# Patient Record
Sex: Female | Born: 1975 | Race: Black or African American | Hispanic: No | Marital: Married | Smoking: Never smoker
Health system: Southern US, Community
[De-identification: ages and names within clinical notes are randomized; demographics above are authoritative.]

## PROBLEM LIST (undated history)

## (undated) ENCOUNTER — Emergency Department (HOSPITAL_COMMUNITY): Payer: 59

## (undated) DIAGNOSIS — Z789 Other specified health status: Secondary | ICD-10-CM

## (undated) HISTORY — DX: Other specified health status: Z78.9

---

## 2004-09-21 ENCOUNTER — Ambulatory Visit: Payer: Self-pay | Admitting: Family Medicine

## 2005-01-07 ENCOUNTER — Inpatient Hospital Stay (HOSPITAL_COMMUNITY): Admission: AD | Admit: 2005-01-07 | Discharge: 2005-01-10 | Payer: Self-pay | Admitting: Physician Assistant

## 2005-01-11 ENCOUNTER — Encounter: Admission: RE | Admit: 2005-01-11 | Discharge: 2005-02-10 | Payer: Self-pay | Admitting: Obstetrics

## 2006-09-30 ENCOUNTER — Ambulatory Visit (HOSPITAL_COMMUNITY): Admission: RE | Admit: 2006-09-30 | Discharge: 2006-09-30 | Payer: Self-pay | Admitting: Family Medicine

## 2007-02-28 ENCOUNTER — Ambulatory Visit: Payer: Self-pay | Admitting: Gynecology

## 2007-02-28 ENCOUNTER — Inpatient Hospital Stay (HOSPITAL_COMMUNITY): Admission: RE | Admit: 2007-02-28 | Discharge: 2007-03-03 | Payer: Self-pay | Admitting: Gynecology

## 2007-06-12 ENCOUNTER — Ambulatory Visit: Payer: Self-pay | Admitting: Family Medicine

## 2011-02-06 NOTE — Discharge Summary (Signed)
NAMEDEVERY, ODWYER               ACCOUNT NO.:  [id number]   MEDICAL RECORD NO.:  [id number]          PATIENT TYPE:  INP   LOCATION:  9128                          [facility]ILITY:  [facility]   PHYSICIAN:  Tracy L. Mayford Knife, M.D.DATE OF BIRTH:  01/13/1976   DATE OF ADMISSION:  02/28/2007  DATE OF DISCHARGE:                               DISCHARGE SUMMARY   ADDENDUM:  Admit hemoglobin was 13.7, discharge hemoglobin 9.3.  Admit  platelets 97,000, discharge platelets 92,000.           ______________________________  Marc Morgans. Mayford Knife, M.D.     TLW/MEDQ  D:  03/02/2007  T:  03/03/2007  Job:  [id number]

## 2011-02-06 NOTE — Op Note (Signed)
NAMEJABREE, PERNICE NO.:  [id number]   MEDICAL RECORD NO.:  [id number]          PATIENT TYPE:  INP   LOCATION:  9199                          [facility]ACILITY:  [facility]   PHYSICIAN:  Ginger Carne, MD  DATE OF BIRTH:  Dec 07, 1975   DATE OF PROCEDURE:  02/28/2007  DATE OF DISCHARGE:                               OPERATIVE REPORT   PREOPERATIVE DIAGNOSIS:  Intrauterine pregnancy at term, breech infant  on ultrasound.   POSTOPERATIVE DIAGNOSIS:  Intrauterine pregnancy at term, cephalic  presentation.   PROCEDURE:  Primary low transverse cesarean section.   SURGEON:  Ginger Carne, M.D.   ASSISTANT:  Paticia Stack, M.D.   ANESTHESIA:  Spinal.   SPECIMEN:  Placenta sent to labor and delivery.   ESTIMATED BLOOD LOSS:  1000 mL.   COMPLICATIONS:  None.   FINDINGS:  Viable female infant with Apgars of 8 and 9 nine at 1 and 5  minutes respectively.  Birth weight 7 pounds 15 ounces.   INDICATIONS FOR PROCEDURE:  This is a 35 year old gravida 2, para 1-0-0-  1, at 40 weeks 5 days gestation, who presented for post date fetal  testing, found to have baby in breech presentation.  She was taken for  primary cesarean section.   DESCRIPTION OF PROCEDURE:  The patient was taken to the operating room  where her spinal anesthesia was found to be adequate.  She was then  prepped and draped in a normal sterile fashion in the dorsal supine  position with a leftward tilt.  A Pfannenstiel skin incision was then  made with a scalpel and carried through to the underlying layer of  fascia.  The fascia was incised in the midline and the incision extended  laterally with the Mayo scissors.  The superior aspect of the fascial  incision was then grasped with Kocher clamps, elevated and the  underlying rectus muscles dissected off bluntly.  Attention was then  turned to the inferior aspect of the incision which, in a similar  fashion, was grasped with Kocher clamps, elevated, and  underlying muscle  rectus muscles dissected off bluntly.  The rectus muscles were then  separated in the midline and the peritoneum identified, tented up and  entered sharply with the Metzenbaum scissors. The peritoneal incision  was then extended superiorly and inferiorly with good visualization of  the bladder. The bladder blade was inserted and the vesicouterine  peritoneum identified, grasped with the pickups, and entered sharply  with the Metzenbaum scissors.  The incision was then extended laterally  and the bladder flap created digitally.   The bladder blade was then reinserted and the lower uterine segment  incised in a transverse fashion with a scalpel.  This uterine incision  was then extended bluntly.  The bladder blade was removed.  The infant  was found to be in cephalic presentation.  The infant's head was  delivered atraumatically.  No nuchal cord.  The nose and mouth were  suctioned and the cord clamped and cut.  The infant was handed off to  the waiting pediatricians.  Cord blood was  obtained.  The placenta was  then removed manually.  The uterus exteriorized and cleared of all clots  and debris.  The uterine incision was repaired with 0 Vicryl in a  running locked fashion.  Several figure-of-eight stitches were used to  reinforce the uterine incision with excellent hemostasis noted.  The  gutters were cleared of all clots and the fascia was reapproximated with  0 Vicryl in a running fashion.  The skin was closed with staples.  The  patient tolerated the procedure well.  Sponge, lap and needle counts  were correct x2.  2 grams of Ancef was given at cord clamp.  The patient  was taken to the recovery room in stable condition.     ______________________________  Paticia Stack, MD      Ginger Carne, MD  Electronically Signed    LNJ/MEDQ  D:  02/28/2007  T:  02/28/2007  Job:  [id number]

## 2011-02-06 NOTE — Discharge Summary (Signed)
NAMECHARLAINE, UTSEY               ACCOUNT NO.:  [id number]   MEDICAL RECORD NO.:  [id number]          PATIENT TYPE:  INP   LOCATION:  9128                          [facility]ILITY:  [facility]   PHYSICIAN:  Tracy L. Mayford Knife, M.D.DATE OF BIRTH:  Jan 24, 1976   DATE OF ADMISSION:  02/28/2007  DATE OF DISCHARGE:  03/03/2007                               DISCHARGE SUMMARY   DISCHARGE DIAGNOSES:  1. Status post primary cesarean section for breech infant that at the      time of delivery was found to be cephalic.  2. Thrombocytopenia.  3. Anemia secondary to acute blood loss.   HOSPITAL COURSE:  The patient is a 35 year old G2, P1-0-0-1 at 40 weeks,  5 days who presented for antenatal testing.  The patient was found to be  breech.  Dr. Mia Creek was consulted, and the decision was made to  proceed with cesarean section.   Please see operative note for full details.  At the time of delivery,  the baby did turn to cephalic.  Postoperative course was unremarkable.  The patient was voiding, ambulating and tolerating p.o.   DISPOSITION:  Home in stable condition.   FO[facility]UP[facility]   DISCHARGE MEDICATIONS:  1. Percocet 5, 1-2 p.o. q.4-6h.  2. Midrin 600 mg one tablet by mouth every 6 hours p.r.n. pain.  3. Micronor one tablet p.o. daily at the same time.  She is to change      birth control when she stops breast feeding.   ACTIVITY:  Nothing per vagina.  No heavy lifting x6 weeks.   DIET:  Regular.           ______________________________  Marc Morgans. Mayford Knife, M.D.     TLW/MEDQ  D:  03/02/2007  T:  03/03/2007  Job:  [id number]

## 2011-02-06 NOTE — Group Therapy Note (Signed)
Heather Hawkins, SPRUNG NO.:  [id number]   MEDICAL RECORD NO.:  [id number]          PATIENT TYPE:  WOC   LOCATION:  [facility]                   FACILITY:  [facility]   PHYSICIAN:  Tinnie Gens, MD        DATE OF BIRTH:  [birth year]   DATE OF SERVICE:  06/12/2007                                  CLINIC NOTE   HISTORY:  This is a G 2, P 2-0-0-2, who is 3-1/2 months status post LTCS  done here for breech presentation.  Her chief concern today is  incisional pain which she describes as sharp, knife-like, and  superficial, worse with leaning forward and sometimes when she moves.  It has been present and pretty much unchanged since her surgery.  She  did go to her 6 week visit at [facility] and had cultures done as  well as Pap smear.  She states she was given 1 tablet at that visit for  possible vaginal infection.  She is breast-feeding and using Micronor  with no problems.  She also reports that since about 2-3 weeks after her  operation, she has had dysuria on and off.  In addition, she has had  dyspareunia with vaginal discomfort as well as pain at the scar.  She  and her husband also are expressing regrets about having had the C-  section.   LIMITED EXAM:  Temp 97.5, pulse 67, BP 117/75, weight 190.  ABDOMEN:  Soft, essentially nontender to palpation.  There is some linea  nigra still present and the same color of more punctate discoloration  extensively surrounding the linea nigra.  The scar itself is healing but  still slightly discolored and red.  There is no induration or erythema  present, and it is essentially nontender to palpation.  PELVIC:  Deferred since this was done a month ago.   ASSESSMENT:  Slow wound healing.   PLAN:  The patient is seen along with Dr. Shawnie Pons, who explained to the  patient that the recovery can be prolonged through the sixth month and  that her pain will improve with time and that there is nothing  consistent with infection that she  should be concerned about.  She also  went over the birth experience with her, and the couple seemed satisfied  with that.  They will try Mederma topically for improvement of  appearance of the scar, and she is to return in 1 year for family  planning, gyn visit.     ______________________________  Caren Griffins, CNM    ______________________________  Tinnie Gens, MD    DP/MEDQ  D:  06/12/2007  T:  06/12/2007  Job:  [id number]

## 2011-07-12 LAB — DIFFERENTIAL
Basophils Absolute: 0
Eosinophils Absolute: 0.1
Eosinophils Relative: 1
Lymphocytes Relative: 24
Neutro Abs: 6.4

## 2011-07-12 LAB — RPR: RPR Ser Ql: NONREACTIVE

## 2011-07-12 LAB — CBC
HCT: 27.8 — ABNORMAL LOW
HCT: 29.1 — ABNORMAL LOW
MCHC: 34.3
MCV: 88.1
RBC: 4.71
RDW: 14.9 — ABNORMAL HIGH
RDW: 15.1 — ABNORMAL HIGH
WBC: 9.9

## 2011-11-06 ENCOUNTER — Ambulatory Visit (HOSPITAL_COMMUNITY)
Admission: RE | Admit: 2011-11-06 | Discharge: 2011-11-06 | Disposition: A | Discharge status: Home / Self Care | Payer: Medicaid Other | Source: Ambulatory Visit | Attending: Obstetrics & Gynecology | Admitting: Obstetrics & Gynecology

## 2011-11-06 ENCOUNTER — Ambulatory Visit (INDEPENDENT_AMBULATORY_CARE_PROVIDER_SITE_OTHER): Payer: Self-pay | Admitting: *Deleted

## 2011-11-06 VITALS — BP 116/73 | Temp 97.0°F | Ht 62.0 in | Wt 210.4 lb

## 2011-11-06 DIAGNOSIS — O09529 Supervision of elderly multigravida, unspecified trimester: Secondary | ICD-10-CM | POA: Insufficient documentation

## 2011-11-06 DIAGNOSIS — Z23 Encounter for immunization: Secondary | ICD-10-CM

## 2011-11-06 DIAGNOSIS — O0930 Supervision of pregnancy with insufficient antenatal care, unspecified trimester: Secondary | ICD-10-CM | POA: Insufficient documentation

## 2011-11-06 MED ORDER — TETANUS-DIPHTH-ACELL PERTUSSIS 5-2.5-18.5 LF-MCG/0.5 IM SUSP
0.5000 mL | Freq: Once | INTRAMUSCULAR | Status: AC
  Administered 2011-11-06: 0.5 mL via INTRAMUSCULAR

## 2011-11-06 MED ORDER — INFLUENZA VIRUS VACC SPLIT PF IM SUSP
0.5000 mL | INTRAMUSCULAR | Status: AC
  Administered 2011-11-06: 0.5 mL via INTRAMUSCULAR

## 2011-11-07 ENCOUNTER — Ambulatory Visit (INDEPENDENT_AMBULATORY_CARE_PROVIDER_SITE_OTHER): Payer: Medicaid Other | Admitting: Physician Assistant

## 2011-11-07 ENCOUNTER — Encounter: Payer: Self-pay | Admitting: Physician Assistant

## 2011-11-07 VITALS — BP 133/80 | Temp 98.2°F | Wt 208.3 lb

## 2011-11-07 DIAGNOSIS — O0930 Supervision of pregnancy with insufficient antenatal care, unspecified trimester: Secondary | ICD-10-CM

## 2011-11-07 DIAGNOSIS — D696 Thrombocytopenia, unspecified: Secondary | ICD-10-CM

## 2011-11-07 LAB — POCT URINALYSIS DIP (DEVICE)
Bilirubin Urine: NEGATIVE
Glucose, UA: NEGATIVE mg/dL
Hgb urine dipstick: NEGATIVE
Ketones, ur: NEGATIVE mg/dL
Urobilinogen, UA: 1 mg/dL (ref 0.0–1.0)

## 2011-11-07 LAB — OBSTETRIC PANEL
Basophils Absolute: 0 10*3/uL (ref 0.0–0.1)
Basophils Relative: 0 % (ref 0–1)
Eosinophils Absolute: 0.1 10*3/uL (ref 0.0–0.7)
HCT: 38.4 % (ref 36.0–46.0)
Hemoglobin: 12.6 g/dL (ref 12.0–15.0)
Lymphocytes Relative: 24 % (ref 12–46)
MCV: 85.7 fL (ref 78.0–100.0)
Monocytes Relative: 6 % (ref 3–12)
Neutrophils Relative %: 69 % (ref 43–77)
Platelets: 103 10*3/uL — ABNORMAL LOW (ref 150–400)
RDW: 13.8 % (ref 11.5–15.5)
Rh Type: POSITIVE

## 2011-11-07 LAB — HIV ANTIBODY (ROUTINE TESTING W REFLEX): HIV: NONREACTIVE

## 2011-11-07 NOTE — Patient Instructions (Signed)

## 2011-11-07 NOTE — Progress Notes (Signed)
Anticipatory guidance  Subjective:    Heather Hawkins is a G3P2002 [redacted]w[redacted]d being seen today for her first obstetrical visit.  Her obstetrical history is significant for advanced maternal age and previous c/s for breech (baby was vtx at time of delivery). Patient does intend to breast feed. Pregnancy history fully reviewed.  Patient reports no complaints.  Filed Vitals:   11/07/11 0912  BP: 133/80  Temp: 98.2 F (36.8 C)  Weight: 208 lb 4.8 oz (94.484 kg)    HISTORY: OB History as of 12/28/11    Grav Para Term Preterm Abortions TAB SAB Ect Mult Living   3 3 3  0 0 0 0 0 0 3     # Outc Date GA Lbr Len/2nd Wgt Sex Del Anes PTL Lv   1 TRM 4/06    F SVD  No Yes   2 TRM 6/08    M CS  No Yes   3 TRM 4/13 [redacted]w[redacted]d 00:00 7lb0.3oz(3.184kg) F LTCS EPI  Yes   Comments: thick meconium, coarse rhonchi, tachycardia     Past Medical History  Diagnosis Date  . No pertinent past medical history     thrombocytopenia   Past Surgical History  Procedure Date  . Cesarean section June 2008    Breech presentation  . Cesarean section 12/25/2011    Procedure: CESAREAN SECTION;  Surgeon: Tereso Newcomer, MD;  Location: [facility];  Service: Gynecology;  Laterality: N/A;  Repeat Cesarean Section Delivery Girl @ 0413, Apgars 7/8   Family History  Problem Relation Age of Onset  . Diabetes Brother      Exam    Uterine Size: 35 cm  Pelvic Exam:    Perineum: Normal Perineum   Vulva: normal   Vagina:  normal mucosa   Cervix: normal   Adnexa: normal adnexa   Bony Pelvis: gynecoid  System: Breast:  normal appearance, no masses or tenderness   Skin: normal coloration and turgor, no rashes    Neurologic: normal   Extremities: normal strength, tone, and muscle mass   HEENT PERRLA   Mouth/Teeth mucous membranes moist, pharynx normal without lesions   Neck supple and no masses   Cardiovascular: regular rate and rhythm   Respiratory:  appears well, vitals normal, no respiratory distress, acyanotic, normal  RR   Abdomen: soft, non-tender; bowel sounds normal; no masses,  no organomegaly   Urinary: urethral meatus normal      Assessment:    Pregnancy: [id number] Patient Active Problem List  Diagnosis  . Thrombocytopenia complicating pregnancy  . Cesarean wound infection        Plan:     Continue Prenatal vitamins. Problem list reviewed and updated. Return in 1 week  ANYANWU,UGONNA A 10/29/2012

## 2011-11-08 LAB — HEMOGLOBINOPATHY EVALUATION: Hgb A: 97.1 % (ref 96.8–97.8)

## 2011-11-10 LAB — CULTURE, OB URINE: Colony Count: >=100000

## 2011-11-14 ENCOUNTER — Ambulatory Visit (INDEPENDENT_AMBULATORY_CARE_PROVIDER_SITE_OTHER): Payer: Self-pay | Admitting: Physician Assistant

## 2011-11-14 VITALS — BP 130/84 | Temp 98.7°F | Wt 209.7 lb

## 2011-11-14 DIAGNOSIS — O169 Unspecified maternal hypertension, unspecified trimester: Secondary | ICD-10-CM

## 2011-11-14 DIAGNOSIS — O139 Gestational [pregnancy-induced] hypertension without significant proteinuria, unspecified trimester: Secondary | ICD-10-CM

## 2011-11-14 LAB — POCT URINALYSIS DIP (DEVICE)
Glucose, UA: NEGATIVE mg/dL
Specific Gravity, Urine: >=1.03 (ref 1.005–1.030)
Urobilinogen, UA: 0.2 mg/dL (ref 0.0–1.0)

## 2011-11-14 NOTE — Patient Instructions (Signed)

## 2011-11-15 ENCOUNTER — Encounter: Payer: Self-pay | Admitting: Physician Assistant

## 2011-11-15 LAB — COMPREHENSIVE METABOLIC PANEL
Albumin: 3.2 g/dL — ABNORMAL LOW (ref 3.5–5.2)
Chloride: 106 mEq/L (ref 96–112)
Creat: 0.5 mg/dL (ref 0.50–1.10)
Glucose, Bld: 76 mg/dL (ref 70–99)
Total Bilirubin: 0.3 mg/dL (ref 0.3–1.2)
Total Protein: 5.8 g/dL — ABNORMAL LOW (ref 6.0–8.3)

## 2011-11-15 LAB — CBC
HCT: 36.9 % (ref 36.0–46.0)
Hemoglobin: 12.7 g/dL (ref 12.0–15.0)
MCV: 84.8 fL (ref 78.0–100.0)

## 2011-11-15 LAB — PROTEIN / CREATININE RATIO, URINE: Creatinine, Urine: 224.3 mg/dL

## 2011-11-21 ENCOUNTER — Ambulatory Visit (INDEPENDENT_AMBULATORY_CARE_PROVIDER_SITE_OTHER): Payer: Self-pay | Admitting: Physician Assistant

## 2011-11-21 ENCOUNTER — Encounter: Payer: Self-pay | Admitting: Physician Assistant

## 2011-11-21 VITALS — BP 110/72 | Temp 97.2°F | Wt 214.1 lb

## 2011-11-21 DIAGNOSIS — Z98891 History of uterine scar from previous surgery: Secondary | ICD-10-CM

## 2011-11-21 DIAGNOSIS — O34219 Maternal care for unspecified type scar from previous cesarean delivery: Secondary | ICD-10-CM

## 2011-11-21 DIAGNOSIS — O0930 Supervision of pregnancy with insufficient antenatal care, unspecified trimester: Secondary | ICD-10-CM

## 2011-11-21 LAB — POCT URINALYSIS DIP (DEVICE)
Hgb urine dipstick: NEGATIVE
Nitrite: NEGATIVE
Protein, ur: NEGATIVE mg/dL

## 2011-11-21 NOTE — Patient Instructions (Signed)

## 2011-11-21 NOTE — Progress Notes (Signed)
No complaints. +FM, GBS collected. Plans TOLAC. Consent Signed.

## 2011-11-21 NOTE — Progress Notes (Signed)
Pulse: 93

## 2011-11-22 ENCOUNTER — Encounter: Payer: Self-pay | Admitting: *Deleted

## 2011-11-24 LAB — CULTURE, BETA STREP (GROUP B ONLY)

## 2011-11-28 DIAGNOSIS — O0930 Supervision of pregnancy with insufficient antenatal care, unspecified trimester: Secondary | ICD-10-CM

## 2011-12-05 ENCOUNTER — Ambulatory Visit (INDEPENDENT_AMBULATORY_CARE_PROVIDER_SITE_OTHER): Payer: Self-pay | Admitting: Advanced Practice Midwife

## 2011-12-05 ENCOUNTER — Encounter: Payer: Self-pay | Admitting: Advanced Practice Midwife

## 2011-12-05 VITALS — BP 116/77 | Temp 97.9°F | Wt 220.4 lb

## 2011-12-05 DIAGNOSIS — R809 Proteinuria, unspecified: Secondary | ICD-10-CM

## 2011-12-05 DIAGNOSIS — O0930 Supervision of pregnancy with insufficient antenatal care, unspecified trimester: Secondary | ICD-10-CM

## 2011-12-05 LAB — GC/CHLAMYDIA PROBE AMP, GENITAL
Chlamydia: NEGATIVE
Gonorrhea: NEGATIVE

## 2011-12-05 LAB — POCT URINALYSIS DIP (DEVICE)
Bilirubin Urine: NEGATIVE
Glucose, UA: NEGATIVE mg/dL
Specific Gravity, Urine: >=1.03 (ref 1.005–1.030)

## 2011-12-05 NOTE — Progress Notes (Signed)
Addended by: Wynelle Bourgeois L on: 12/05/2011 11:15 AM   Modules accepted: Orders

## 2011-12-05 NOTE — Progress Notes (Signed)
P-102 

## 2011-12-05 NOTE — Progress Notes (Signed)
Addended by: Lynnell Dike on: 12/05/2011 11:41 AM   Modules accepted: Orders

## 2011-12-05 NOTE — Progress Notes (Signed)
Urine sent for culture due to leukocytes and blood and will also do GC/Chlam on urine

## 2011-12-05 NOTE — Patient Instructions (Signed)
Normal Labor and Delivery Your caregiver must first be sure you are in labor. Signs of labor include:  You may pass what is called "the mucus plug" before labor begins. This is a small amount of blood stained mucus.   Regular uterine contractions.   The time between contractions get closer together.   The discomfort and pain gradually gets more intense.   Pains are mostly located in the back.   Pains get worse when walking.   The cervix (the opening of the uterus becomes thinner (begins to efface) and opens up (dilates).  Once you are in labor and admitted into the hospital or care center, your caregiver will do the following:  A complete physical examination.   Check your vital signs (blood pressure, pulse, temperature and the fetal heart rate).   Do a vaginal examination (using a sterile glove and lubricant) to determine:   The position (presentation) of the baby (head [vertex] or buttock first).   The level (station) of the baby's head in the birth canal.   The effacement and dilatation of the cervix.   You may have your pubic hair shaved and be given an enema depending on your caregiver and the circumstance.   An electronic monitor is usually placed on your abdomen. The monitor follows the length and intensity of the contractions, as well as the baby's heart rate.   Usually, your caregiver will insert an IV in your arm with a bottle of sugar water. This is done as a precaution so that medications can be given to you quickly during labor or delivery.  NORMAL LABOR AND DELIVERY IS DIVIDED UP INTO 3 STAGES: First Stage This is when regular contractions begin and the cervix begins to efface and dilate. This stage can last from 3 to 15 hours. The end of the first stage is when the cervix is 100% effaced and 10 centimeters dilated. Pain medications may be given by   Injection (morphine, demerol, etc.)   Regional anesthesia (spinal, caudal or epidural, anesthetics given in  different locations of the spine). Paracervical pain medication may be given, which is an injection of and anesthetic on each side of the cervix.  A pregnant woman may request to have "Natural Childbirth" which is not to have any medications or anesthesia during her labor and delivery. Second Stage This is when the baby comes down through the birth canal (vagina) and is born. This can take 1 to 4 hours. As the baby's head comes down through the birth canal, you may feel like you are going to have a bowel movement. You will get the urge to bear down and push until the baby is delivered. As the baby's head is being delivered, the caregiver will decide if an episiotomy (a cut in the perineum and vagina area) is needed to prevent tearing of the tissue in this area. The episiotomy is sewn up after the delivery of the baby and placenta. Sometimes a mask with nitrous oxide is given for the mother to breath during the delivery of the baby to help if there is too much pain. The end of Stage 2 is when the baby is fully delivered. Then when the umbilical cord stops pulsating it is clamped and cut. Third Stage The third stage begins after the baby is completely delivered and ends after the placenta (afterbirth) is delivered. This usually takes 5 to 30 minutes. After the placenta is delivered, a medication is given either by intravenous or injection to help contract   the uterus and prevent bleeding. The third stage is not painful and pain medication is usually not necessary. If an episiotomy was done, it is repaired at this time. After the delivery, the mother is watched and monitored closely for 1 to 2 hours to make sure there is no postpartum bleeding (hemorrhage). If there is a lot of bleeding, medication is given to contract the uterus and stop the bleeding. Document Released: 06/19/2008 Document Revised: 08/30/2011 Document Reviewed: 06/19/2008 [employer] Patient Information 2012 [employer]. 

## 2011-12-05 NOTE — Progress Notes (Signed)
No contractions, leaking or bleeding. Reviewed OP note, 2 layer closure.

## 2011-12-07 LAB — CULTURE, OB URINE: Colony Count: 40000

## 2011-12-12 ENCOUNTER — Ambulatory Visit (INDEPENDENT_AMBULATORY_CARE_PROVIDER_SITE_OTHER): Payer: Self-pay | Admitting: Physician Assistant

## 2011-12-12 VITALS — BP 119/76 | Temp 97.8°F | Wt 222.2 lb

## 2011-12-12 DIAGNOSIS — O0930 Supervision of pregnancy with insufficient antenatal care, unspecified trimester: Secondary | ICD-10-CM

## 2011-12-12 DIAGNOSIS — O34219 Maternal care for unspecified type scar from previous cesarean delivery: Secondary | ICD-10-CM

## 2011-12-12 LAB — POCT URINALYSIS DIP (DEVICE): Nitrite: NEGATIVE

## 2011-12-12 NOTE — Progress Notes (Signed)
No complains. Irregular contractions and pain. ROB and NST/AFI at next visit. Will schedule IOL if needed.

## 2011-12-12 NOTE — Patient Instructions (Signed)

## 2011-12-12 NOTE — Progress Notes (Signed)
P87.  C/o pressure last night and today. Declines interpreter.

## 2011-12-19 ENCOUNTER — Telehealth (HOSPITAL_COMMUNITY): Payer: Self-pay | Admitting: *Deleted

## 2011-12-19 ENCOUNTER — Encounter (HOSPITAL_COMMUNITY): Payer: Self-pay | Admitting: *Deleted

## 2011-12-19 ENCOUNTER — Ambulatory Visit (INDEPENDENT_AMBULATORY_CARE_PROVIDER_SITE_OTHER): Payer: Medicaid Other | Admitting: Obstetrics and Gynecology

## 2011-12-19 VITALS — BP 126/77 | Temp 97.1°F | Wt 221.6 lb

## 2011-12-19 DIAGNOSIS — N390 Urinary tract infection, site not specified: Secondary | ICD-10-CM

## 2011-12-19 DIAGNOSIS — O99119 Other diseases of the blood and blood-forming organs and certain disorders involving the immune mechanism complicating pregnancy, unspecified trimester: Secondary | ICD-10-CM | POA: Insufficient documentation

## 2011-12-19 DIAGNOSIS — O480 Post-term pregnancy: Secondary | ICD-10-CM

## 2011-12-19 DIAGNOSIS — O34219 Maternal care for unspecified type scar from previous cesarean delivery: Secondary | ICD-10-CM

## 2011-12-19 DIAGNOSIS — D696 Thrombocytopenia, unspecified: Secondary | ICD-10-CM | POA: Insufficient documentation

## 2011-12-19 DIAGNOSIS — O0930 Supervision of pregnancy with insufficient antenatal care, unspecified trimester: Secondary | ICD-10-CM

## 2011-12-19 LAB — POCT URINALYSIS DIP (DEVICE)
Bilirubin Urine: NEGATIVE
Glucose, UA: NEGATIVE mg/dL
Hgb urine dipstick: NEGATIVE
Ketones, ur: NEGATIVE mg/dL
Nitrite: NEGATIVE
Protein, ur: NEGATIVE mg/dL
Specific Gravity, Urine: >=1.03 (ref 1.005–1.030)
Urobilinogen, UA: 1 mg/dL (ref 0.0–1.0)
pH: 5.5 (ref 5.0–8.0)

## 2011-12-19 NOTE — Telephone Encounter (Signed)
Preadmission screen  

## 2011-12-19 NOTE — Patient Instructions (Signed)
Fetal Movement Counts Patient Name: __________________________________________________ Patient Due Date: ____________________ Kick counts is highly recommended in high risk pregnancies, but it is a good idea for every pregnant woman to do. Start counting fetal movements at 28 weeks of the pregnancy. Fetal movements increase after eating a full meal or eating or drinking something sweet (the blood sugar is higher). It is also important to drink plenty of fluids (well hydrated) before doing the count. Lie on your left side because it helps with the circulation or you can sit in a comfortable chair with your arms over your belly (abdomen) with no distractions around you. DOING THE COUNT  Try to do the count the same time of day each time you do it.   Mark the day and time, then see how long it takes for you to feel 10 movements (kicks, flutters, swishes, rolls). You should have at least 10 movements within 2 hours. You will most likely feel 10 movements in much less than 2 hours. If you do not, wait an hour and count again. After a couple of days you will see a pattern.   What you are looking for is a change in the pattern or not enough counts in 2 hours. Is it taking longer in time to reach 10 movements?  SEEK MEDICAL CARE IF:  You feel less than 10 counts in 2 hours. Tried twice.   No movement in one hour.   The pattern is changing or taking longer each day to reach 10 counts in 2 hours.   You feel the baby is not moving as it usually does.  Date: ____________ Movements: ____________ Start time: ____________ Finish time: ____________  Date: ____________ Movements: ____________ Start time: ____________ Finish time: ____________ Date: ____________ Movements: ____________ Start time: ____________ Finish time: ____________ Date: ____________ Movements: ____________ Start time: ____________ Finish time: ____________ Date: ____________ Movements: ____________ Start time: ____________ Finish time:  ____________ Date: ____________ Movements: ____________ Start time: ____________ Finish time: ____________ Date: ____________ Movements: ____________ Start time: ____________ Finish time: ____________ Date: ____________ Movements: ____________ Start time: ____________ Finish time: ____________  Date: ____________ Movements: ____________ Start time: ____________ Finish time: ____________ Date: ____________ Movements: ____________ Start time: ____________ Finish time: ____________ Date: ____________ Movements: ____________ Start time: ____________ Finish time: ____________ Date: ____________ Movements: ____________ Start time: ____________ Finish time: ____________ Date: ____________ Movements: ____________ Start time: ____________ Finish time: ____________ Date: ____________ Movements: ____________ Start time: ____________ Finish time: ____________ Date: ____________ Movements: ____________ Start time: ____________ Finish time: ____________  Date: ____________ Movements: ____________ Start time: ____________ Finish time: ____________ Date: ____________ Movements: ____________ Start time: ____________ Finish time: ____________ Date: ____________ Movements: ____________ Start time: ____________ Finish time: ____________ Date: ____________ Movements: ____________ Start time: ____________ Finish time: ____________ Date: ____________ Movements: ____________ Start time: ____________ Finish time: ____________ Date: ____________ Movements: ____________ Start time: ____________ Finish time: ____________ Date: ____________ Movements: ____________ Start time: ____________ Finish time: ____________  Date: ____________ Movements: ____________ Start time: ____________ Finish time: ____________ Date: ____________ Movements: ____________ Start time: ____________ Finish time: ____________ Date: ____________ Movements: ____________ Start time: ____________ Finish time: ____________ Date: ____________ Movements:  ____________ Start time: ____________ Finish time: ____________ Date: ____________ Movements: ____________ Start time: ____________ Finish time: ____________ Date: ____________ Movements: ____________ Start time: ____________ Finish time: ____________ Date: ____________ Movements: ____________ Start time: ____________ Finish time: ____________  Date: ____________ Movements: ____________ Start time: ____________ Finish time: ____________ Date: ____________ Movements: ____________ Start time: ____________ Finish time: ____________ Date: ____________ Movements: ____________ Start time:   ____________ Finish time: ____________ Date: ____________ Movements: ____________ Start time: ____________ Finish time: ____________ Date: ____________ Movements: ____________ Start time: ____________ Finish time: ____________ Date: ____________ Movements: ____________ Start time: ____________ Finish time: ____________ Date: ____________ Movements: ____________ Start time: ____________ Finish time: ____________  Date: ____________ Movements: ____________ Start time: ____________ Finish time: ____________ Date: ____________ Movements: ____________ Start time: ____________ Finish time: ____________ Date: ____________ Movements: ____________ Start time: ____________ Finish time: ____________ Date: ____________ Movements: ____________ Start time: ____________ Finish time: ____________ Date: ____________ Movements: ____________ Start time: ____________ Finish time: ____________ Date: ____________ Movements: ____________ Start time: ____________ Finish time: ____________ Date: ____________ Movements: ____________ Start time: ____________ Finish time: ____________  Date: ____________ Movements: ____________ Start time: ____________ Finish time: ____________ Date: ____________ Movements: ____________ Start time: ____________ Finish time: ____________ Date: ____________ Movements: ____________ Start time: ____________ Finish  time: ____________ Date: ____________ Movements: ____________ Start time: ____________ Finish time: ____________ Date: ____________ Movements: ____________ Start time: ____________ Finish time: ____________ Date: ____________ Movements: ____________ Start time: ____________ Finish time: ____________ Date: ____________ Movements: ____________ Start time: ____________ Finish time: ____________  Date: ____________ Movements: ____________ Start time: ____________ Finish time: ____________ Date: ____________ Movements: ____________ Start time: ____________ Finish time: ____________ Date: ____________ Movements: ____________ Start time: ____________ Finish time: ____________ Date: ____________ Movements: ____________ Start time: ____________ Finish time: ____________ Date: ____________ Movements: ____________ Start time: ____________ Finish time: ____________ Date: ____________ Movements: ____________ Start time: ____________ Finish time: ____________ Document Released: 10/10/2006 Document Revised: 08/30/2011 Document Reviewed: 04/12/2009 [employer] Patient Information 2012 [employer]. 

## 2011-12-19 NOTE — Progress Notes (Signed)
40.6 with reassuring fetal testing, AFI 9.2, good FM. Wants TOLAC . Scheduled for 41.4. Kick counts and S/Sx labor reviewed.  Had enterococcus ASB not treated in Feb-> check C&S. Had low plts at NOB 103k.No known hx per pt.  Check on labor admission.

## 2011-12-19 NOTE — Progress Notes (Signed)
IOL scheduled 12/24/11 @ 0730. Labor precautions given

## 2011-12-19 NOTE — Progress Notes (Signed)
P-89 

## 2011-12-19 NOTE — Telephone Encounter (Signed)
[id number] 

## 2011-12-24 ENCOUNTER — Inpatient Hospital Stay (HOSPITAL_COMMUNITY)
Admission: RE | Admit: 2011-12-24 | Discharge: 2011-12-28 | DRG: 765 | Disposition: A | Discharge status: Home / Self Care | Payer: Medicaid Other | Source: Ambulatory Visit | Attending: Family Medicine | Admitting: Family Medicine

## 2011-12-24 ENCOUNTER — Encounter (HOSPITAL_COMMUNITY): Payer: Self-pay | Admitting: Anesthesiology

## 2011-12-24 ENCOUNTER — Encounter (HOSPITAL_COMMUNITY): Payer: Self-pay

## 2011-12-24 VITALS — BP 110/75 | HR 73 | Temp 97.8°F | Resp 18 | Ht 62.0 in | Wt 221.0 lb

## 2011-12-24 DIAGNOSIS — O34219 Maternal care for unspecified type scar from previous cesarean delivery: Secondary | ICD-10-CM

## 2011-12-24 DIAGNOSIS — O0930 Supervision of pregnancy with insufficient antenatal care, unspecified trimester: Secondary | ICD-10-CM

## 2011-12-24 DIAGNOSIS — D696 Thrombocytopenia, unspecified: Secondary | ICD-10-CM | POA: Diagnosis present

## 2011-12-24 DIAGNOSIS — D689 Coagulation defect, unspecified: Secondary | ICD-10-CM | POA: Diagnosis present

## 2011-12-24 DIAGNOSIS — B952 Enterococcus as the cause of diseases classified elsewhere: Secondary | ICD-10-CM

## 2011-12-24 DIAGNOSIS — O09529 Supervision of elderly multigravida, unspecified trimester: Secondary | ICD-10-CM | POA: Diagnosis present

## 2011-12-24 DIAGNOSIS — O480 Post-term pregnancy: Secondary | ICD-10-CM | POA: Diagnosis present

## 2011-12-24 DIAGNOSIS — O411090 Infection of amniotic sac and membranes, unspecified, unspecified trimester, not applicable or unspecified: Secondary | ICD-10-CM | POA: Diagnosis present

## 2011-12-24 DIAGNOSIS — Z98891 History of uterine scar from previous surgery: Secondary | ICD-10-CM

## 2011-12-24 LAB — CBC
HCT: 37.8 % (ref 36.0–46.0)
MCH: 28.6 pg (ref 26.0–34.0)
MCHC: 33.2 g/dL (ref 30.0–36.0)
MCV: 86 fL (ref 78.0–100.0)
MCV: 86.5 fL (ref 78.0–100.0)
Platelets: 119 10*3/uL — ABNORMAL LOW (ref 150–400)
Platelets: 118 10*3/uL — ABNORMAL LOW (ref 150–400)
RBC: 4.37 MIL/uL (ref 3.87–5.11)
RDW: 13.9 % (ref 11.5–15.5)
RDW: 14 % (ref 11.5–15.5)
WBC: 9.9 10*3/uL (ref 4.0–10.5)
WBC: 12.4 10*3/uL — ABNORMAL HIGH (ref 4.0–10.5)

## 2011-12-24 LAB — COMPREHENSIVE METABOLIC PANEL
Albumin: 2.5 g/dL — ABNORMAL LOW (ref 3.5–5.2)
BUN: 11 mg/dL (ref 6–23)
Calcium: 9.1 mg/dL (ref 8.4–10.5)
Creatinine, Ser: 0.57 mg/dL (ref 0.50–1.10)
GFR calc Af Amer: >90 mL/min (ref >90)
Total Protein: 5.9 g/dL — ABNORMAL LOW (ref 6.0–8.3)

## 2011-12-24 MED ORDER — SODIUM CHLORIDE 0.9 % IV SOLN: 250.0000 mL | INTRAVENOUS | Status: DC | PRN

## 2011-12-24 MED ORDER — IBUPROFEN 600 MG PO TABS: 600.0000 mg | ORAL_TABLET | Freq: Four times a day (QID) | ORAL | Status: DC | PRN

## 2011-12-24 MED ORDER — LIDOCAINE HCL (PF) 1 % IJ SOLN
INTRAMUSCULAR | Status: DC | PRN
  Administered 2011-12-24 (×2): 8 mL
  Administered 2011-12-25: 5 mL

## 2011-12-24 MED ORDER — LACTATED RINGERS IV SOLN
INTRAVENOUS | Status: DC
  Administered 2011-12-24 (×2): via INTRAVENOUS

## 2011-12-24 MED ORDER — OXYCODONE-ACETAMINOPHEN 5-325 MG PO TABS: 1.0000 | ORAL_TABLET | ORAL | Status: DC | PRN

## 2011-12-24 MED ORDER — SODIUM CHLORIDE 0.9 % IJ SOLN: 3.0000 mL | INTRAMUSCULAR | Status: DC | PRN

## 2011-12-24 MED ORDER — LACTATED RINGERS IV SOLN: 500.0000 mL | Freq: Once | INTRAVENOUS | Status: DC

## 2011-12-24 MED ORDER — TERBUTALINE SULFATE 1 MG/ML IJ SOLN: 0.2500 mg | Freq: Once | INTRAMUSCULAR | Status: DC | PRN

## 2011-12-24 MED ORDER — EPHEDRINE 5 MG/ML INJ: 10.0000 mg | INTRAVENOUS | Status: DC | PRN

## 2011-12-24 MED ORDER — CITRIC ACID-SODIUM CITRATE 334-500 MG/5ML PO SOLN
30.0000 mL | ORAL | Status: DC | PRN
  Administered 2011-12-25: 30 mL via ORAL
  Filled 2011-12-24: qty 15

## 2011-12-24 MED ORDER — LACTATED RINGERS IV SOLN
500.0000 mL | INTRAVENOUS | Status: DC | PRN
  Administered 2011-12-25: 500 mL via INTRAVENOUS

## 2011-12-24 MED ORDER — TERBUTALINE SULFATE 1 MG/ML IJ SOLN: 0.2500 mg | Freq: Once | INTRAMUSCULAR | Status: AC | PRN

## 2011-12-24 MED ORDER — OXYTOCIN 20 UNITS IN LACTATED RINGERS INFUSION - SIMPLE: 125.0000 mL/h | Freq: Once | INTRAVENOUS | Status: DC

## 2011-12-24 MED ORDER — FENTANYL 2.5 MCG/ML BUPIVACAINE 1/10 % EPIDURAL INFUSION (WH - ANES)
14.0000 mL/h | INTRAMUSCULAR | Status: DC
  Administered 2011-12-25: 14 mL/h via EPIDURAL
  Filled 2011-12-24 (×2): qty 60

## 2011-12-24 MED ORDER — FENTANYL 2.5 MCG/ML BUPIVACAINE 1/10 % EPIDURAL INFUSION (WH - ANES)
INTRAMUSCULAR | Status: DC | PRN
  Administered 2011-12-24: 14 mL/h via EPIDURAL

## 2011-12-24 MED ORDER — FENTANYL CITRATE 0.05 MG/ML IJ SOLN
100.0000 ug | INTRAMUSCULAR | Status: DC | PRN
Start: 2011-12-24 — End: 2011-12-25
  Administered 2011-12-24: 100 ug via INTRAVENOUS
  Filled 2011-12-24: qty 2

## 2011-12-24 MED ORDER — PHENYLEPHRINE 40 MCG/ML (10ML) SYRINGE FOR IV PUSH (FOR BLOOD PRESSURE SUPPORT)
80.0000 ug | PREFILLED_SYRINGE | INTRAVENOUS | Status: DC | PRN
  Filled 2011-12-24: qty 5

## 2011-12-24 MED ORDER — ONDANSETRON HCL 4 MG/2ML IJ SOLN: 4.0000 mg | Freq: Four times a day (QID) | INTRAMUSCULAR | Status: DC | PRN

## 2011-12-24 MED ORDER — SODIUM CHLORIDE 0.9 % IJ SOLN: 3.0000 mL | Freq: Two times a day (BID) | INTRAMUSCULAR | Status: DC

## 2011-12-24 MED ORDER — LIDOCAINE HCL (PF) 1 % IJ SOLN: 30.0000 mL | INTRAMUSCULAR | Status: DC | PRN

## 2011-12-24 MED ORDER — DIPHENHYDRAMINE HCL 50 MG/ML IJ SOLN: 12.5000 mg | INTRAMUSCULAR | Status: DC | PRN

## 2011-12-24 MED ORDER — OXYTOCIN BOLUS FROM INFUSION
500.0000 mL | Freq: Once | INTRAVENOUS | Status: DC
  Filled 2011-12-24: qty 500

## 2011-12-24 MED ORDER — OXYTOCIN 20 UNITS IN LACTATED RINGERS INFUSION - SIMPLE
1.0000 m[IU]/min | INTRAVENOUS | Status: DC
  Administered 2011-12-24: 2 m[IU]/min via INTRAVENOUS
  Filled 2011-12-24: qty 1000

## 2011-12-24 MED ORDER — EPHEDRINE 5 MG/ML INJ
10.0000 mg | INTRAVENOUS | Status: DC | PRN
  Filled 2011-12-24: qty 4

## 2011-12-24 MED ORDER — ACETAMINOPHEN 325 MG PO TABS
650.0000 mg | ORAL_TABLET | ORAL | Status: DC | PRN
  Administered 2011-12-24: 650 mg via ORAL
  Filled 2011-12-24: qty 2
  Filled 2011-12-24: qty 1

## 2011-12-24 MED ORDER — OXYTOCIN 20 UNITS IN LACTATED RINGERS INFUSION - SIMPLE: 1.0000 m[IU]/min | INTRAVENOUS | Status: DC

## 2011-12-24 MED ORDER — PHENYLEPHRINE 40 MCG/ML (10ML) SYRINGE FOR IV PUSH (FOR BLOOD PRESSURE SUPPORT): 80.0000 ug | PREFILLED_SYRINGE | INTRAVENOUS | Status: DC | PRN

## 2011-12-24 MED ORDER — FLEET ENEMA 7-19 GM/118ML RE ENEM: 1.0000 | ENEMA | RECTAL | Status: DC | PRN

## 2011-12-24 NOTE — Anesthesia Preprocedure Evaluation (Signed)
Anesthesia Evaluation  Patient identified by MRN, date of birth, ID band Patient awake    Reviewed: Allergy & Precautions, H&P , NPO status , Patient's Chart, lab work & pertinent test results  Airway Mallampati: II TM Distance: >3 FB Neck ROM: full    Dental No notable dental hx.    Pulmonary neg pulmonary ROS,    Pulmonary exam normal       Cardiovascular negative cardio ROS      Neuro/Psych negative neurological ROS  negative psych ROS   GI/Hepatic negative GI ROS, Neg liver ROS,   Endo/Other  Morbid obesity  Renal/GU negative Renal ROS  negative genitourinary   Musculoskeletal negative musculoskeletal ROS (+)   Abdominal (+) + obese,   Peds negative pediatric ROS (+)  Hematology negative hematology ROS (+)   Anesthesia Other Findings   Reproductive/Obstetrics (+) Pregnancy                           Anesthesia Physical Anesthesia Plan  ASA: III  Anesthesia Plan: Epidural   Post-op Pain Management:    Induction:   Airway Management Planned:   Additional Equipment:   Intra-op Plan:   Post-operative Plan:   Informed Consent: I have reviewed the patients History and Physical, chart, labs and discussed the procedure including the risks, benefits and alternatives for the proposed anesthesia with the patient or authorized representative who has indicated his/her understanding and acceptance.     Plan Discussed with:   Anesthesia Plan Comments:         Anesthesia Quick Evaluation  

## 2011-12-24 NOTE — Progress Notes (Signed)
Heather Hawkins is a 36 y.o. G3P2002 at [redacted]w[redacted]d   Subjective: Comfortable with epidural  Objective: BP 119/65  Pulse 69  Temp(Src) 99 F (37.2 C) (Oral)  Resp 18  Ht 5\' 2"  (1.575 m)  Wt 221 lb (100.245 kg)  BMI 40.42 kg/m2  SpO2 100%  LMP 03/08/2011      FHT:  FHR: 155 bpm, variability: moderate,  accelerations:  Present,  decelerations:  Absent UC:   regular, every 1-4 minutes, MVUs 210-260 on 6mu of pitocin SVE:   Dilation: 4.5 Effacement (%): 60 Station: -3 Exam by:: a. white rn  Labs: Lab Results  Component Value Date   WBC 12.4* 12/24/2011   HGB 12.6 12/24/2011   HCT 37.8 12/24/2011   MCV 86.5 12/24/2011   PLT 118* 12/24/2011    Assessment / Plan: Induction of labor due to postterm,  progressing well on pitocin  Labor: Progressing normally Preeclampsia:  n/a Fetal Wellbeing:  Category I Pain Control:  Epidural I/D:  n/a Anticipated MOD:  NSVD  Heather Hawkins E. 12/24/2011, 10:37 PM

## 2011-12-24 NOTE — Anesthesia Procedure Notes (Signed)
Epidural Patient location during procedure: OB Start time: 12/24/2011 8:30 PM End time: 12/24/2011 8:34 PM Reason for block: procedure for pain  Staffing Anesthesiologist: Sandrea Hughs Performed by: anesthesiologist   Preanesthetic Checklist Completed: patient identified, site marked, surgical consent, pre-op evaluation, timeout performed, IV checked, risks and benefits discussed and monitors and equipment checked  Epidural Patient position: sitting Prep: site prepped and draped and DuraPrep Patient monitoring: continuous pulse ox and blood pressure Approach: midline Injection technique: LOR air  Needle:  Needle type: Tuohy  Needle gauge: 17 G Needle length: 9 cm Needle insertion depth: 5 cm cm Catheter type: closed end flexible Catheter size: 19 Gauge Catheter at skin depth: 10 cm Test dose: negative and Other  Assessment Events: blood not aspirated, injection not painful, no injection resistance, negative IV test and no paresthesia

## 2011-12-24 NOTE — Progress Notes (Signed)
Heather Hawkins is a 36 y.o. G3P2002 at [redacted]w[redacted]d  Subjective: No complaints  Objective: BP 117/73  Pulse 68  Temp(Src) 98.6 F (37 C) (Oral)  Resp 20  Ht 5\' 2"  (1.575 m)  Wt 221 lb (100.245 kg)  BMI 40.42 kg/m2  LMP 03/08/2011      FHT:  FHR: 150 bpm, variability: moderate,  accelerations:  Present,  decelerations:  Absent UC:   regular, every 2-5 minutes SVE:   Dilation: 4.5 Effacement (%): 60 Station: -3 Exam by:: Maylon Cos, CNM IUPC placed  Labs: Lab Results  Component Value Date   WBC 9.9 12/24/2011   HGB 12.5 12/24/2011   HCT 37.6 12/24/2011   MCV 86.0 12/24/2011   PLT 119* 12/24/2011    Assessment / Plan: IOL in process  Labor: Will start pitocin and titrate to MVUs Preeclampsia:  n/a Fetal Wellbeing:  Category I Pain Control:  Epidural now I/D:  n/a Anticipated MOD:  NSVD Will re-eval in 2-3 hours or prn  Heather Hawkins E. 12/24/2011, 7:21 PM

## 2011-12-24 NOTE — Progress Notes (Signed)
Taneil Bin is a 36 y.o. G3P2002 at [redacted]w[redacted]d   Subjective: Contraction pain improved after IV fentanyl  Objective: BP 129/69  Pulse 67  Temp(Src) 98.5 F (36.9 C) (Oral)  Resp 20  Ht 5\' 2"  (1.575 m)  Wt 221 lb (100.245 kg)  BMI 40.42 kg/m2  LMP 03/08/2011      FHT:  FHR: 130 bpm, variability: moderate,  accelerations:  Present,  decelerations:  Absent UC:   regular, every 3-4 minutes SVE:  Foley bulb out  Labs: Lab Results  Component Value Date   WBC 9.9 12/24/2011   HGB 12.5 12/24/2011   HCT 37.6 12/24/2011   MCV 86.0 12/24/2011   PLT 119* 12/24/2011    Assessment / Plan: IOL in progress  Labor: Progressing normally Preeclampsia:  n/a Fetal Wellbeing:  Category I Pain Control:  Fentanyl I/D:  n/a Anticipated MOD:  NSVD Will re-eval in 2 hours or prn. If no change, will start pitocin.   Syretta Kochel E. 12/24/2011, 4:52 PM

## 2011-12-24 NOTE — H&P (Signed)
Attestation of Attending Supervision of Advanced Practitioner: Evaluation and management procedures were performed by the PA/NP/CNM/OB Fellow under my supervision/collaboration. Chart reviewed, and agree with management and plan.  Jaynie Collins, M.D. 12/24/2011 1:12 PM

## 2011-12-24 NOTE — H&P (Signed)
  Chief Complaint:  IOL for post-dates  Heather Hawkins is  36 y.o. [id number].  Patient's last menstrual period was 03/08/2011. [redacted]w[redacted]d  Patient presents for IOL secondary to post-dates. Pregnancy has been complicated by late entry to care and previous c-section 5 years ago for malpresentation. Pt first pregnancy was term SVD without complication. Pt and husband are extremely motivated for VBAC.  Obstetrical/Gynecological History: OB History    Grav Para Term Preterm Abortions TAB SAB Ect Mult Living   3 2 2       2       Past Medical History: Past Medical History  Diagnosis Date  . No pertinent past medical history     Past Surgical History: Past Surgical History  Procedure Date  . Cesarean section     Family History: Family History  Problem Relation Age of Onset  . Diabetes Brother     Social History: History  Substance Use Topics  . Smoking status: Never Smoker   . Smokeless tobacco: Never Used  . Alcohol Use: No    Allergies: No Known Allergies  Prescriptions prior to admission  Medication Sig Dispense Refill  . Prenatal Vit-Fe Fumarate-FA (PRENATAL MULTIVITAMIN) 60-1 MG tablet Take 1 tablet by mouth daily.        Review of Systems - History obtained from chart review and the patient Breast ROS: negative for breast lumps Respiratory ROS: no cough, shortness of breath, or wheezing Cardiovascular ROS: no chest pain or dyspnea on exertion Gastrointestinal ROS: irregular contractions Neurological ROS: negative Dermatological ROS: negative  Physical Exam   Last menstrual period 03/08/2011.  General: General appearance - alert, well appearing, and in no distress, oriented to person, place, and time and overweight Mental status - alert, oriented to person, place, and time, normal mood, behavior, speech, dress, motor activity, and thought processes, affect appropriate to mood Mouth - mucous membranes moist, pharynx normal without lesions Chest - clear to auscultation,  no wheezes, rales or rhonchi, symmetric air entry Heart - normal rate, regular rhythm, normal S1, S2, no murmurs, rubs, clicks or gallops Abdomen - gravid, non tender Breasts - breasts appear normal, no suspicious masses, no skin or nipple changes or axillary nodes Neurological - alert, oriented, normal speech, no focal findings or movement disorder noted, screening mental status exam normal Musculoskeletal - no joint tenderness, deformity or swelling Extremities - pedal edema 2 + Focused Gynecological Exam: 1-2/50/vtx/-3 Foley bulb placed without complication, filled with 60cc NS. Pt tolerated well.  Labs: O pos, GBS neg  Assessment: 1. IOL for post-dates 2. TOLAC with previous SVD at term 3. Thrombocytopenia  Plan: Admit to BS with routine orders Foley bulb for cervical ripening  Heather Hawkins E. 12/24/2011,7:45 AM

## 2011-12-24 NOTE — Progress Notes (Addendum)
Heather Hawkins is a 36 y.o. G3P2002 at [redacted]w[redacted]d. IOL in process for post-dates  Subjective: Increasing discomfort with contraction. Leaking fluid  Objective: BP 116/72  Pulse 67  Temp(Src) 97.9 F (36.6 C) (Oral)  Resp 20  Ht 5\' 2"  (1.575 m)  Wt 221 lb (100.245 kg)  BMI 40.42 kg/m2  LMP 03/08/2011      FHT:  FHR: 130 bpm, variability: moderate,  accelerations:  Present,  decelerations:  Absent UC:   regular, every 5 minutes SVE:   Dilation: 1 Effacement (%): 50 Station: -3 Exam by:: Maylon Cos, CNM  Foley bulb in place  Labs: Lab Results  Component Value Date   WBC 9.9 12/24/2011   HGB 12.5 12/24/2011   HCT 37.6 12/24/2011   MCV 86.0 12/24/2011   PLT 119* 12/24/2011    Assessment / Plan: IOL in progress, Thrombocytopenia stable  Labor: Progressing normally Preeclampsia:  n/a Fetal Wellbeing:  Category I Pain Control:  Labor support without medications I/D:  n/a Anticipated MOD:  NSVD Will re-eval in 3-4 hours or prn  Evelio Rueda E. 12/24/2011, 12:13 PM

## 2011-12-25 ENCOUNTER — Inpatient Hospital Stay (HOSPITAL_COMMUNITY): Payer: Medicaid Other | Admitting: Anesthesiology

## 2011-12-25 ENCOUNTER — Encounter (HOSPITAL_COMMUNITY)
Admission: RE | Disposition: A | Discharge status: Home / Self Care | Payer: Self-pay | Source: Ambulatory Visit | Attending: Family Medicine

## 2011-12-25 ENCOUNTER — Encounter (HOSPITAL_COMMUNITY): Payer: Self-pay

## 2011-12-25 ENCOUNTER — Encounter (HOSPITAL_COMMUNITY): Payer: Self-pay | Admitting: Anesthesiology

## 2011-12-25 DIAGNOSIS — O34219 Maternal care for unspecified type scar from previous cesarean delivery: Secondary | ICD-10-CM

## 2011-12-25 DIAGNOSIS — O9912 Other diseases of the blood and blood-forming organs and certain disorders involving the immune mechanism complicating childbirth: Secondary | ICD-10-CM

## 2011-12-25 DIAGNOSIS — D689 Coagulation defect, unspecified: Secondary | ICD-10-CM

## 2011-12-25 DIAGNOSIS — O480 Post-term pregnancy: Secondary | ICD-10-CM

## 2011-12-25 DIAGNOSIS — O411090 Infection of amniotic sac and membranes, unspecified, unspecified trimester, not applicable or unspecified: Secondary | ICD-10-CM

## 2011-12-25 LAB — CBC
MCH: 28.6 pg (ref 26.0–34.0)
MCV: 86.9 fL (ref 78.0–100.0)
Platelets: 95 10*3/uL — ABNORMAL LOW (ref 150–400)
RDW: 14.2 % (ref 11.5–15.5)
WBC: 15.2 10*3/uL — ABNORMAL HIGH (ref 4.0–10.5)

## 2011-12-25 SURGERY — Surgical Case
Anesthesia: Epidural | Site: Abdomen | Wound class: Clean Contaminated

## 2011-12-25 MED ORDER — MORPHINE SULFATE (PF) 0.5 MG/ML IJ SOLN
INTRAMUSCULAR | Status: DC | PRN
  Administered 2011-12-25: 1 mg via INTRAVENOUS
  Administered 2011-12-25: 4 mg via EPIDURAL

## 2011-12-25 MED ORDER — ONDANSETRON HCL 4 MG/2ML IJ SOLN
INTRAMUSCULAR | Status: DC | PRN
  Administered 2011-12-25: 4 mg via INTRAVENOUS

## 2011-12-25 MED ORDER — BUPIVACAINE HCL (PF) 0.25 % IJ SOLN
INTRAMUSCULAR | Status: DC | PRN
  Administered 2011-12-25: 30 mL

## 2011-12-25 MED ORDER — OXYTOCIN 20 UNITS IN LACTATED RINGERS INFUSION - SIMPLE
INTRAVENOUS | Status: AC
  Administered 2011-12-25: 20 [IU]
  Filled 2011-12-25: qty 1000

## 2011-12-25 MED ORDER — ZOLPIDEM TARTRATE 5 MG PO TABS: 5.0000 mg | ORAL_TABLET | Freq: Every evening | ORAL | Status: DC | PRN

## 2011-12-25 MED ORDER — MEPERIDINE HCL 25 MG/ML IJ SOLN: 6.2500 mg | INTRAMUSCULAR | Status: DC | PRN

## 2011-12-25 MED ORDER — DIPHENHYDRAMINE HCL 25 MG PO CAPS: 25.0000 mg | ORAL_CAPSULE | ORAL | Status: DC | PRN

## 2011-12-25 MED ORDER — SODIUM CHLORIDE 0.9 % IV SOLN
1.0000 ug/kg/h | INTRAVENOUS | Status: DC | PRN
  Filled 2011-12-25: qty 2.5

## 2011-12-25 MED ORDER — ONDANSETRON HCL 4 MG PO TABS: 4.0000 mg | ORAL_TABLET | ORAL | Status: DC | PRN

## 2011-12-25 MED ORDER — SODIUM CHLORIDE 0.9 % IV SOLN
1.0000 g | Freq: Four times a day (QID) | INTRAVENOUS | Status: DC
  Administered 2011-12-25: 1 g via INTRAVENOUS
  Filled 2011-12-25 (×3): qty 1000

## 2011-12-25 MED ORDER — NALOXONE HCL 0.4 MG/ML IJ SOLN: 0.4000 mg | INTRAMUSCULAR | Status: DC | PRN

## 2011-12-25 MED ORDER — LACTATED RINGERS IV BOLUS (SEPSIS)
500.0000 mL | Freq: Once | INTRAVENOUS | Status: AC
  Administered 2011-12-25: 500 mL via INTRAVENOUS

## 2011-12-25 MED ORDER — OXYTOCIN 20 UNITS IN LACTATED RINGERS INFUSION - SIMPLE
125.0000 mL/h | INTRAVENOUS | Status: AC
  Filled 2011-12-25: qty 1000

## 2011-12-25 MED ORDER — NALBUPHINE HCL 10 MG/ML IJ SOLN
5.0000 mg | INTRAMUSCULAR | Status: DC | PRN
  Filled 2011-12-25: qty 1

## 2011-12-25 MED ORDER — OXYCODONE-ACETAMINOPHEN 5-325 MG PO TABS
1.0000 | ORAL_TABLET | ORAL | Status: DC | PRN
  Administered 2011-12-25 (×2): 1 via ORAL
  Administered 2011-12-26 (×3): 2 via ORAL
  Administered 2011-12-26 – 2011-12-27 (×2): 1 via ORAL
  Administered 2011-12-27: 2 via ORAL
  Administered 2011-12-27 – 2011-12-28 (×2): 1 via ORAL
  Filled 2011-12-25: qty 1
  Filled 2011-12-25: qty 2
  Filled 2011-12-25: qty 1
  Filled 2011-12-25: qty 2
  Filled 2011-12-25 (×3): qty 1
  Filled 2011-12-25: qty 2
  Filled 2011-12-25: qty 1
  Filled 2011-12-25 (×2): qty 2
  Filled 2011-12-25: qty 1
  Filled 2011-12-25: qty 2

## 2011-12-25 MED ORDER — GENTAMICIN SULFATE 40 MG/ML IJ SOLN
160.0000 mg | Freq: Three times a day (TID) | INTRAVENOUS | Status: DC
  Filled 2011-12-25 (×2): qty 4

## 2011-12-25 MED ORDER — DIPHENHYDRAMINE HCL 50 MG/ML IJ SOLN
12.5000 mg | INTRAMUSCULAR | Status: DC | PRN
  Administered 2011-12-25: 12.5 mg via INTRAVENOUS
  Filled 2011-12-25: qty 1

## 2011-12-25 MED ORDER — WITCH HAZEL-GLYCERIN EX PADS
1.0000 | MEDICATED_PAD | CUTANEOUS | Status: DC | PRN
Start: 2011-12-25 — End: 2011-12-28

## 2011-12-25 MED ORDER — LANOLIN HYDROUS EX OINT: 1.0000 "application " | TOPICAL_OINTMENT | CUTANEOUS | Status: DC | PRN

## 2011-12-25 MED ORDER — SODIUM CHLORIDE 0.9 % IJ SOLN: 3.0000 mL | INTRAMUSCULAR | Status: DC | PRN

## 2011-12-25 MED ORDER — SENNOSIDES-DOCUSATE SODIUM 8.6-50 MG PO TABS
2.0000 | ORAL_TABLET | Freq: Every day | ORAL | Status: DC
  Administered 2011-12-25 – 2011-12-27 (×2): 2 via ORAL

## 2011-12-25 MED ORDER — SIMETHICONE 80 MG PO CHEW
80.0000 mg | CHEWABLE_TABLET | ORAL | Status: DC | PRN
  Administered 2011-12-25 – 2011-12-28 (×7): 80 mg via ORAL

## 2011-12-25 MED ORDER — ONDANSETRON HCL 4 MG/2ML IJ SOLN: 4.0000 mg | INTRAMUSCULAR | Status: DC | PRN

## 2011-12-25 MED ORDER — SODIUM CHLORIDE 0.9 % IJ SOLN
3.0000 mL | Freq: Two times a day (BID) | INTRAMUSCULAR | Status: DC
  Administered 2011-12-26: 3 mL via INTRAVENOUS

## 2011-12-25 MED ORDER — LACTATED RINGERS IV SOLN
INTRAVENOUS | Status: DC | PRN
  Administered 2011-12-25 (×2): via INTRAVENOUS

## 2011-12-25 MED ORDER — KETOROLAC TROMETHAMINE 60 MG/2ML IM SOLN
INTRAMUSCULAR | Status: AC
  Administered 2011-12-25: 60 mg via INTRAMUSCULAR
  Filled 2011-12-25: qty 2

## 2011-12-25 MED ORDER — SODIUM BICARBONATE 8.4 % IV SOLN
INTRAVENOUS | Status: DC | PRN
  Administered 2011-12-25: 5 mL via EPIDURAL

## 2011-12-25 MED ORDER — OXYTOCIN 10 UNIT/ML IJ SOLN
INTRAMUSCULAR | Status: DC | PRN
  Administered 2011-12-25: 20 [IU] via INTRAMUSCULAR

## 2011-12-25 MED ORDER — KETOROLAC TROMETHAMINE 30 MG/ML IJ SOLN: 30.0000 mg | Freq: Four times a day (QID) | INTRAMUSCULAR | Status: AC | PRN

## 2011-12-25 MED ORDER — MORPHINE SULFATE (PF) 0.5 MG/ML IJ SOLN: INTRAMUSCULAR | Status: DC | PRN

## 2011-12-25 MED ORDER — SODIUM CHLORIDE 0.9 % IV SOLN
1.0000 g | INTRAVENOUS | Status: DC
  Filled 2011-12-25 (×4): qty 1000

## 2011-12-25 MED ORDER — LACTATED RINGERS IV BOLUS (SEPSIS)
500.0000 mL | Freq: Once | INTRAVENOUS | Status: AC
  Administered 2011-12-25: 1000 mL via INTRAVENOUS

## 2011-12-25 MED ORDER — SCOPOLAMINE 1 MG/3DAYS TD PT72
1.0000 | MEDICATED_PATCH | Freq: Once | TRANSDERMAL | Status: AC
  Administered 2011-12-25: 1.5 mg via TRANSDERMAL

## 2011-12-25 MED ORDER — ACETAMINOPHEN 325 MG PO TABS
325.0000 mg | ORAL_TABLET | Freq: Once | ORAL | Status: AC
  Administered 2011-12-25: 325 mg via ORAL

## 2011-12-25 MED ORDER — IBUPROFEN 600 MG PO TABS
600.0000 mg | ORAL_TABLET | Freq: Four times a day (QID) | ORAL | Status: DC
  Administered 2011-12-27 – 2011-12-28 (×6): 600 mg via ORAL
  Filled 2011-12-25 (×3): qty 1
  Filled 2011-12-25: qty 12
  Filled 2011-12-25 (×3): qty 1

## 2011-12-25 MED ORDER — MAGNESIUM HYDROXIDE 400 MG/5ML PO SUSP: 30.0000 mL | ORAL | Status: DC | PRN

## 2011-12-25 MED ORDER — PROMETHAZINE HCL 25 MG/ML IJ SOLN: 6.2500 mg | INTRAMUSCULAR | Status: DC | PRN

## 2011-12-25 MED ORDER — ONDANSETRON HCL 4 MG/2ML IJ SOLN
INTRAMUSCULAR | Status: AC
  Filled 2011-12-25: qty 2

## 2011-12-25 MED ORDER — MENTHOL 3 MG MT LOZG: 1.0000 | LOZENGE | OROMUCOSAL | Status: DC | PRN

## 2011-12-25 MED ORDER — SODIUM BICARBONATE 8.4 % IV SOLN
INTRAVENOUS | Status: AC
  Filled 2011-12-25: qty 50

## 2011-12-25 MED ORDER — HYDROMORPHONE HCL PF 1 MG/ML IJ SOLN
INTRAMUSCULAR | Status: AC
  Filled 2011-12-25: qty 1

## 2011-12-25 MED ORDER — GENTAMICIN SULFATE 40 MG/ML IJ SOLN
180.0000 mg | Freq: Once | INTRAVENOUS | Status: AC
  Administered 2011-12-25: 180 mg via INTRAVENOUS
  Filled 2011-12-25: qty 4.5

## 2011-12-25 MED ORDER — PRENATAL MULTIVITAMIN CH
1.0000 | ORAL_TABLET | Freq: Every day | ORAL | Status: DC
  Administered 2011-12-26 – 2011-12-28 (×3): 1 via ORAL
  Filled 2011-12-25 (×4): qty 1

## 2011-12-25 MED ORDER — KETOROLAC TROMETHAMINE 60 MG/2ML IM SOLN: 60.0000 mg | Freq: Once | INTRAMUSCULAR | Status: DC | PRN

## 2011-12-25 MED ORDER — DIPHENHYDRAMINE HCL 25 MG PO CAPS: 25.0000 mg | ORAL_CAPSULE | Freq: Four times a day (QID) | ORAL | Status: DC | PRN

## 2011-12-25 MED ORDER — ONDANSETRON HCL 4 MG/2ML IJ SOLN: 4.0000 mg | Freq: Three times a day (TID) | INTRAMUSCULAR | Status: DC | PRN

## 2011-12-25 MED ORDER — KETOROLAC TROMETHAMINE 30 MG/ML IJ SOLN: 15.0000 mg | Freq: Once | INTRAMUSCULAR | Status: DC | PRN

## 2011-12-25 MED ORDER — BUPIVACAINE HCL (PF) 0.25 % IJ SOLN
INTRAMUSCULAR | Status: AC
  Filled 2011-12-25: qty 30

## 2011-12-25 MED ORDER — 0.9 % SODIUM CHLORIDE (POUR BTL) OPTIME
TOPICAL | Status: DC | PRN
  Administered 2011-12-25: 1000 mL

## 2011-12-25 MED ORDER — DIBUCAINE 1 % RE OINT
1.0000 | TOPICAL_OINTMENT | RECTAL | Status: DC | PRN
Start: 2011-12-25 — End: 2011-12-28

## 2011-12-25 MED ORDER — HYDROMORPHONE HCL PF 1 MG/ML IJ SOLN
0.2500 mg | INTRAMUSCULAR | Status: DC | PRN
  Administered 2011-12-25 (×2): 0.5 mg via INTRAVENOUS

## 2011-12-25 MED ORDER — TETANUS-DIPHTH-ACELL PERTUSSIS 5-2.5-18.5 LF-MCG/0.5 IM SUSP: 0.5000 mL | Freq: Once | INTRAMUSCULAR | Status: DC

## 2011-12-25 MED ORDER — OXYTOCIN 10 UNIT/ML IJ SOLN
INTRAMUSCULAR | Status: AC
  Filled 2011-12-25: qty 2

## 2011-12-25 MED ORDER — PHENYLEPHRINE HCL 10 MG/ML IJ SOLN
INTRAMUSCULAR | Status: DC | PRN
  Administered 2011-12-25: 40 ug via INTRAVENOUS
  Administered 2011-12-25: 120 ug via INTRAVENOUS
  Administered 2011-12-25: 40 ug via INTRAVENOUS
  Administered 2011-12-25: 120 ug via INTRAVENOUS
  Administered 2011-12-25: 80 ug via INTRAVENOUS
  Administered 2011-12-25: 120 ug via INTRAVENOUS
  Administered 2011-12-25: 80 ug via INTRAVENOUS

## 2011-12-25 MED ORDER — DIPHENHYDRAMINE HCL 50 MG/ML IJ SOLN: 25.0000 mg | INTRAMUSCULAR | Status: DC | PRN

## 2011-12-25 MED ORDER — SCOPOLAMINE 1 MG/3DAYS TD PT72
MEDICATED_PATCH | TRANSDERMAL | Status: AC
  Filled 2011-12-25: qty 1

## 2011-12-25 MED ORDER — LIDOCAINE-EPINEPHRINE (PF) 2 %-1:200000 IJ SOLN
INTRAMUSCULAR | Status: AC
  Filled 2011-12-25: qty 20

## 2011-12-25 MED ORDER — MORPHINE SULFATE 0.5 MG/ML IJ SOLN
INTRAMUSCULAR | Status: AC
  Filled 2011-12-25: qty 10

## 2011-12-25 MED ORDER — SODIUM CHLORIDE 0.9 % IV SOLN: 250.0000 mL | INTRAVENOUS | Status: DC

## 2011-12-25 SURGICAL SUPPLY — 44 items
APL SKNCLS STERI-STRIP NONHPOA (GAUZE/BANDAGES/DRESSINGS) ×1
BENZOIN TINCTURE PRP APPL 2/3 (GAUZE/BANDAGES/DRESSINGS) ×1 IMPLANT
CHLORAPREP W/TINT 26ML (MISCELLANEOUS) ×2 IMPLANT
CLOTH BEACON ORANGE TIMEOUT ST (SAFETY) ×2 IMPLANT
DRESSING TELFA 8X3 (GAUZE/BANDAGES/DRESSINGS) ×2 IMPLANT
DRSG COVADERM 4X10 (GAUZE/BANDAGES/DRESSINGS) ×1 IMPLANT
ELECT REM PT RETURN 9FT ADLT (ELECTROSURGICAL) ×2
ELECTRODE REM PT RTRN 9FT ADLT (ELECTROSURGICAL) ×1 IMPLANT
EXTRACTOR VACUUM M CUP 4 TUBE (SUCTIONS) IMPLANT
GAUZE SPONGE 4X4 12PLY STRL LF (GAUZE/BANDAGES/DRESSINGS) ×4 IMPLANT
GLOVE BIO SURGEON STRL SZ7 (GLOVE) ×2 IMPLANT
GLOVE BIO SURGEON STRL SZ7.5 (GLOVE) ×1 IMPLANT
GLOVE BIOGEL PI IND STRL 7.0 (GLOVE) ×2 IMPLANT
GLOVE BIOGEL PI INDICATOR 7.0 (GLOVE) ×2
GLOVE SKINSENSE NS SZ7.5 (GLOVE) ×1
GLOVE SKINSENSE NS SZ8.0 LF (GLOVE) ×1
GLOVE SKINSENSE STRL SZ7.5 (GLOVE) IMPLANT
GLOVE SKINSENSE STRL SZ8.0 LF (GLOVE) IMPLANT
GOWN PREVENTION PLUS LG XLONG (DISPOSABLE) ×6 IMPLANT
KIT ABG SYR 3ML LUER SLIP (SYRINGE) ×1 IMPLANT
NDL HYPO 25X5/8 SAFETYGLIDE (NEEDLE) ×1 IMPLANT
NEEDLE HYPO 22GX1.5 SAFETY (NEEDLE) ×2 IMPLANT
NEEDLE HYPO 25X5/8 SAFETYGLIDE (NEEDLE) ×2 IMPLANT
NS IRRIG 1000ML POUR BTL (IV SOLUTION) ×2 IMPLANT
PACK C SECTION WH (CUSTOM PROCEDURE TRAY) ×2 IMPLANT
PAD ABD 7.5X8 STRL (GAUZE/BANDAGES/DRESSINGS) ×1 IMPLANT
RTRCTR C-SECT PINK 25CM LRG (MISCELLANEOUS) ×1 IMPLANT
SLEEVE SCD COMPRESS KNEE LRG (MISCELLANEOUS) IMPLANT
SLEEVE SCD COMPRESS KNEE MED (MISCELLANEOUS) ×1 IMPLANT
STAPLER VISISTAT 35W (STAPLE) IMPLANT
STRIP CLOSURE SKIN 1/2X4 (GAUZE/BANDAGES/DRESSINGS) ×1 IMPLANT
SUT MNCRL 0 VIOLET CTX 36 (SUTURE) ×2 IMPLANT
SUT MONOCRYL 0 CTX 36 (SUTURE) ×2
SUT PDS AB 0 CT1 27 (SUTURE) ×1 IMPLANT
SUT PLAIN 2 0 (SUTURE) ×2
SUT PLAIN ABS 2-0 CT1 27XMFL (SUTURE) IMPLANT
SUT VIC AB 0 CT1 36 (SUTURE) ×4 IMPLANT
SUT VIC AB 2-0 CT1 27 (SUTURE)
SUT VIC AB 2-0 CT1 TAPERPNT 27 (SUTURE) IMPLANT
SUT VIC AB 4-0 KS 27 (SUTURE) ×2 IMPLANT
SYR CONTROL 10ML LL (SYRINGE) ×2 IMPLANT
TOWEL OR 17X24 6PK STRL BLUE (TOWEL DISPOSABLE) ×4 IMPLANT
TRAY FOLEY CATH 14FR (SET/KITS/TRAYS/PACK) ×1 IMPLANT
WATER STERILE IRR 1000ML POUR (IV SOLUTION) ×1 IMPLANT

## 2011-12-25 NOTE — Progress Notes (Signed)
ANTIBIOTIC CONSULT NOTE - INITIAL  Pharmacy Consult for gentamicin Indication: maternal fever  No Known Allergies  Patient Measurements: Height: 5\' 2"  (157.5 cm) Weight: 221 lb (100.245 kg) IBW/kg (Calculated) : 50.1  Adjusted Body Weight: 65.2 kg  Vital Signs: Temp: 100.9 F (38.3 C) (04/02 0002) Temp src: Oral (04/02 0002) BP: 85/54 mmHg (04/02 0032) Pulse Rate: 91  (04/02 0032) Intake/Output from previous day:   Intake/Output from this shift:    Labs:  Basename 12/24/11 1935 12/24/11 0815 12/24/11 0800  WBC 12.4* 9.9 --  HGB 12.6 12.5 --  PLT 118* 119* --  LABCREA -- -- --  CREATININE -- -- 0.57   Estimated Creatinine Clearance: 108.6 ml/min (by C-G formula based on Cr of 0.57).   Microbiology: Recent Results (from the past 720 hour(s))  CULTURE, OB URINE     Status: Normal   Collection Time   12/05/11 11:50 AM      Component Value Range Status Comment   Colony Count 40,000 COLONIES/ML   Final    Organism ID, Bacteria Multiple bacterial morphotypes present, none   Final    Organism ID, Bacteria predominant. Suggest appropriate recollection if    Final    Organism ID, Bacteria clinically indicated.   Final   CULTURE, OB URINE     Status: Normal   Collection Time   12/19/11 11:58 AM      Component Value Range Status Comment   Colony Count 20,OOO COLONIES/ML   Final    Organism ID, Bacteria GROUP B STREP (S.[email or web address]) ISOLATED   Final     Medical History: Past Medical History  Diagnosis Date  . No pertinent past medical history     thrombocytopenia    Medications:     Ampicillin 1gm IV q6h Assessment:    Coverage for infection of pelvic origin;  R/O chorioamnionitis  Goal of Therapy:     Desire peak gentamicin level 6.5-8 mcg/ml & trough level <1 mcg/ml  Plan:     1.  Loading dose gentamicin = 180mg  x 1, and then    2.  Maintenance regimen gentamicin 160mg  q8h    3.  Will order actual serum gentamicin levels if therapy continued > 48hr, or as  clinically indicated  Scarlett Presto 12/25/2011,12:36 AM

## 2011-12-25 NOTE — Addendum Note (Signed)
Addendum  created 12/25/11 1557 by Christene Lye, CRNA   Modules edited:Notes Section

## 2011-12-25 NOTE — Progress Notes (Signed)
UR chart review completed.  

## 2011-12-25 NOTE — Progress Notes (Signed)
Heather Hawkins is a 36 y.o. G3P2002 at [redacted]w[redacted]d   Subjective: Comfortable with epidural. No complaints  Objective: BP 98/35  Pulse 98  Temp(Src) 102.4 F (39.1 C) (Oral)  Resp 18  Ht 5\' 2"  (1.575 m)  Wt 221 lb (100.245 kg)  BMI 40.42 kg/m2  SpO2 100%  LMP 03/08/2011      FHT:  FHR: 185 bpm, variability: moderate,  accelerations:  Present,  decelerations:  Absent UC:   regular, every 1-2 minutes, MVUs SVE:   Dilation: 6 Effacement (%): 70 Station: -1;-2 Exam by:: S. Cheray Pardi CNM  Labs: Lab Results  Component Value Date   WBC 12.4* 12/24/2011   HGB 12.6 12/24/2011   HCT 37.8 12/24/2011   MCV 86.5 12/24/2011   PLT 118* 12/24/2011    Assessment / Plan: Induction of labor due to postterm,  progressing well on pitocin  Labor: Slow progression Preeclampsia:  n/a Fetal Wellbeing:  Category II Pain Control:  Epidural I/D:  IV Ampicillin and Gent completed Anticipated MOD:  Discussed with Dr. Macon Large, pt desire to wait 1 hour for fever to regress. If temp not less and no further cervical change, requesting RLTCS.   Heather Hawkins E. 12/25/2011, 2:55 AM

## 2011-12-25 NOTE — Progress Notes (Signed)
Patient ID: Heather Hawkins, female   DOB: Aug 25, 1976, 36 y.o.   MRN: [id number]  Asked to see pt.  I have received report of low urine output and lightheadedness and drop in BP with standing. Hgb reviewed and noted to be 11.  She looks well.  The RN states she cannot feel fundus.  Pt. Is mildly distended and is tender.  I cannot find fundus either. I suspect she is well.  Will continue IV fluids and check on her later.

## 2011-12-25 NOTE — Progress Notes (Signed)
Heather Hawkins is a 36 y.o. G3P2002 at [redacted]w[redacted]d   Subjective: Comfortable with epidural, some chills  Objective: BP 114/66  Pulse 81  Temp(Src) 100.9 F (38.3 C) (Oral)  Resp 18  Ht 5\' 2"  (1.575 m)  Wt 221 lb (100.245 kg)  BMI 40.42 kg/m2  SpO2 100%  LMP 03/08/2011      FHT:  FHR: 165 bpm, variability: moderate,  accelerations:  Present,  decelerations:  Absent UC:   regular, every 1-4 minutes, MVUs 160-210, 9 mu pitocin SVE:  5-6/60/vtx/-2 Labs: Lab Results  Component Value Date   WBC 12.4* 12/24/2011   HGB 12.6 12/24/2011   HCT 37.8 12/24/2011   MCV 86.5 12/24/2011   PLT 118* 12/24/2011    Assessment / Plan: Induction of labor due to postterm,  progressing well on pitocin Intrapartum fever  Labor: Progressing normally Preeclampsia:  N/A Fetal Wellbeing:  Category II Pain Control:  Epidural I/D:  Ampicillin/Gent for presumed chorio, tylenol Anticipated MOD:  NSVD  Heather Hawkins E. 12/25/2011, 12:23 AM

## 2011-12-25 NOTE — Progress Notes (Signed)
Provider at bedside; pt request to proceed with c-section; pt and spouse educated, both verbalized understanding

## 2011-12-25 NOTE — Anesthesia Postprocedure Evaluation (Signed)
  Anesthesia Post-op Note  Patient: Heather Hawkins  Procedure(s) Performed: Procedure(s) (LRB): CESAREAN SECTION (N/A)  Patient Location: [facility]  Anesthesia Type: Spinal  Level of Consciousness: awake, alert  and oriented  Airway and Oxygen Therapy: Patient Spontanous Breathing  Post-op Pain: none  Post-op Assessment: Post-op Vital signs reviewed, Patient's Cardiovascular Status Stable, No headache, No backache, No residual numbness and No residual motor weakness  Post-op Vital Signs: Reviewed and stable  Complications: No apparent anesthesia complications

## 2011-12-25 NOTE — Anesthesia Postprocedure Evaluation (Signed)
Anesthesia Post Note  Patient: Heather Hawkins  Procedure(s) Performed: Procedure(s) (LRB): CESAREAN SECTION (N/A)  Anesthesia type: epidural  Patient location: PACU  Post pain: Pain level controlled  Post assessment: Post-op Vital signs reviewed  Last Vitals:  Filed Vitals:   12/25/11 0515  BP: 100/46  Pulse: 90  Temp:   Resp: 21    Post vital signs: Reviewed  Level of consciousness: awake  Complications: No apparent anesthesia complications

## 2011-12-25 NOTE — Progress Notes (Signed)
Attending Note  Patient has been undergoing induction of labor for postdates; she is currently at 6 cm and has been making slow cervical change.  She also has developed chorioamnionitis and has been treated with Amp and Gent.  She has a Category II tracing, baseline in 180s, moderate variability, no accelerations, no decelerations.  Given her slow progress, patient has opted for repeat cesarean section.  The risks of repeat cesarean section discussed with the patient included but were not limited to: bleeding which may require transfusion or reoperation; infection which may require antibiotics; injury to bowel, bladder, ureters or other surrounding organs; injury to the fetus; need for additional procedures including hysterectomy in the event of a life-threatening hemorrhage; placental abnormalities wth subsequent pregnancies, incisional problems, thromboembolic phenomenon and other postoperative/anesthesia complications. The patient concurred with the proposed plan, giving informed written consent for the procedure.   Anesthesia and OR aware.  Preoperative prophylactic Ancef ordered on call to the OR.  To OR when ready.  Jaynie Collins, M.D. 12/25/2011 3:32 AM

## 2011-12-25 NOTE — Op Note (Signed)
Cotina Capaldi PROCEDURE DATE: 12/24/2011 - 12/25/2011  PREOPERATIVE DIAGNOSIS: Intrauterine pregnancy at  [redacted]w[redacted]d weeks gestation; maternal request for repeat cesarean section; prolonged first stage of labor; chorioamnionitis   POSTOPERATIVE DIAGNOSIS: The same  PROCEDURE: Repeat Low Transverse Cesarean Section  SURGEON:  Dr. Jaynie Collins  ASSISTANT:  Dr. Candelaria Celeste  ANESTHESIOLOGIST: Sandrea Hughs., MD  INDICATIONS: Lamija Besse is a 36 y.o. [id number] at [redacted]w[redacted]d taken for repeat cesarean section secondary to the indications as above. The risks of cesarean section were discussed with the patient including but were not limited to: bleeding which may require transfusion or reoperation; infection which may require antibiotics; injury to bowel, bladder, ureters or other surrounding organs; injury to the fetus; need for additional procedures including hysterectomy in the event of a life-threatening hemorrhage; placental abnormalities wth subsequent pregnancies, incisional problems, thromboembolic phenomenon and other postoperative/anesthesia complications.   The patient concurred with the proposed plan, giving informed written consent for the procedure.    FINDINGS:  Viable female infant in cephalic presentation.  Apgars 7 and 8,  Arterial cord pH 7.299, weight 7 pounds.  Thick meconium noted in amniotic fluid.  Intact placenta, significant amount of retroplacental bleeding, three vessel cord.  Normal uterus, fallopian tubes and ovaries bilaterally.  ANESTHESIA: Epidural INTRAVENOUS FLUIDS: 1300 ml ESTIMATED BLOOD LOSS: 500 ml URINE OUTPUT:  150 ml SPECIMENS: Placenta sent to pathology COMPLICATIONS: None immediate  PROCEDURE IN DETAIL:  The patient already received intravenous antibiotics for treatment of her chorioamnionitis, and had sequential compression devices applied to her lower extremities.  She was then taken to the operating room where the epidural anesthesia was dosed up to  surgical level and was found to be adequate. She was then placed in a dorsal supine position with a leftward tilt, and prepped and draped in a sterile manner.  A foley catheter was placed into her bladder and attached to constant gravity.  After an adequate timeout was performed, a Pfannenstiel skin incision was made with scalpel over her preexisting scar and carried through to the underlying layer of fascia. The fascia was incised in the midline, and this incision was extended bilaterally using the Mayo scissors.  Kocher clamps were applied to the superior aspect of the fascial incision and the underlying rectus muscles were dissected off bluntly and sharply. A similar process was carried out on the inferior aspect of the facial incision. The rectus muscles were separated in the midline bluntly and the peritoneum was entered sharply. Attention was turned to the lower uterine segment where a low transverse hysterotomy was made with a scalpel and extended bilaterally bluntly.  The infant was successfully delivered, the cord was clamped and cut and the infant was handed over to awaiting neonatology team.  The placenta delivered intact with a three-vessel cord. The uterus was then cleared of clot and debris.  The hysterotomy was closed with 0 Monocryl in a running locked fashion, and an imbricating layer was also placed with a 0 Monocryl suture. The pelvis was cleared of all clot and debris. Hemostasis was confirmed on all surfaces.  The peritoneum and the muscles were reapproximated using 0 Monocryl interrupted stitches. The fascia was then closed using 0 PDS in a running fashion.  The subcutaneous layer was irrigated, then reapproximated with 2-0 plain gut interrupted stitches.  30 ml of 0.25% Marcaine was used to infiltrate the subcutaneous tissues, and the skin was closed with a 4-0 Vicryl subcuticular stitch. The patient tolerated the procedure well. Sponge, lap, instrument  and needle counts were correct x 2.  She  was taken to the recovery room in stable condition.

## 2011-12-25 NOTE — Transfer of Care (Signed)
Immediate Anesthesia Transfer of Care Note  Patient: Heather Hawkins  Procedure(s) Performed: Procedure(s) (LRB): CESAREAN SECTION (N/A)  Patient Location: PACU  Anesthesia Type: Epidural  Level of Consciousness: awake, alert  and oriented  Airway & Oxygen Therapy: Patient Spontanous Breathing  Post-op Assessment: Report given to PACU RN and Post -op Vital signs reviewed and stable  Post vital signs: Reviewed and stable  Complications: No apparent anesthesia complications

## 2011-12-26 ENCOUNTER — Encounter (HOSPITAL_COMMUNITY): Payer: Self-pay | Admitting: Obstetrics & Gynecology

## 2011-12-26 LAB — CBC
MCHC: 32.2 g/dL (ref 30.0–36.0)
Platelets: 102 10*3/uL — ABNORMAL LOW (ref 150–400)
RDW: 14.4 % (ref 11.5–15.5)

## 2011-12-26 NOTE — Progress Notes (Signed)
Post Partum Day 1 Subjective: no complaints, up ad lib, voiding, tolerating PO, + flatus and states she is having a lot of gas pains.   Objective: Blood pressure 104/70, pulse 71, temperature 98.3 F (36.8 C), temperature source Oral, resp. rate 18, height 5\' 2"  (1.575 m), weight 100.245 kg (221 lb), last menstrual period 03/08/2011, SpO2 100.00%, unknown if currently breastfeeding.  Physical Exam:  General: alert, cooperative and appears stated age Lochia: appropriate Uterine Fundus: firm DVT Evaluation: No evidence of DVT seen on physical exam. Negative Homan's sign. No cords or calf tenderness. No significant calf/ankle edema.   Basename 12/26/11 0525 12/25/11 1540  HGB 9.8* 11.1*  HCT 30.4* 33.7*    Assessment/Plan: Plan for discharge tomorrow, Breastfeeding and Contraception OCPs.    LOS: 2 days   Iverson Alamin 12/26/2011, 7:57 AM    Evaluation and management procedures were performed by PA student under my supervision/collaboration. Chart reviewed, patient examined by me and I agree with management and plan.

## 2011-12-26 NOTE — Anesthesia Postprocedure Evaluation (Signed)
  Anesthesia Post-op Note  Patient: Heather Hawkins  Procedure(s) Performed: Procedure(s) (LRB): CESAREAN SECTION (N/A)  Patient Location: Mother/Baby  Anesthesia Type: Epidural  Level of Consciousness: oriented  Airway and Oxygen Therapy: Patient Spontanous Breathing  Post-op Pain: mild  Post-op Assessment: Patient's Cardiovascular Status Stable and Respiratory Function Stable  Post-op Vital Signs: stable  Complications: No apparent anesthesia complications

## 2011-12-26 NOTE — Addendum Note (Signed)
Addendum  created 12/26/11 0743 by Renford Dills, CRNA   Modules edited:Notes Section

## 2011-12-27 NOTE — Progress Notes (Signed)
Post Partum Day 2 Subjective: no complaints, up ad lib, voiding, tolerating PO, + flatus and reports slight pain in her lower extremities due to swelling. Breastfeeding. OCPs.  Baby is in NICU due to a sepsis r/o.   Objective: Blood pressure 111/75, pulse 74, temperature 98.2 F (36.8 C), temperature source Oral, resp. rate 18, height 5\' 2"  (1.575 m), weight 100.245 kg (221 lb), last menstrual period 03/08/2011, SpO2 98.00%, unknown if currently breastfeeding.  Physical Exam:  General: alert, cooperative and appears stated age Lochia: appropriate Uterine Fundus: firm Incision: healing well, no dehiscence, no significant erythema, slight clear drainage noted throughout incision.  DVT Evaluation: No evidence of DVT seen on physical exam. Negative Homan's sign. No cords or calf tenderness. 1+ pitting edema   Basename 12/26/11 0525 12/25/11 1540  HGB 9.8* 11.1*  HCT 30.4* 33.7*    Assessment/Plan: Plan for discharge tomorrow, Breastfeeding and Contraception OCPs.   LOS: 3 days   Iverson Alamin 12/27/2011, 7:40 AM    I examined the patient. Her incision is dry and intact. Plan to continue present care. Cherina Dhillon 10:02 AM

## 2011-12-28 DIAGNOSIS — M7989 Other specified soft tissue disorders: Secondary | ICD-10-CM

## 2011-12-28 DIAGNOSIS — M79609 Pain in unspecified limb: Secondary | ICD-10-CM

## 2011-12-28 DIAGNOSIS — O0930 Supervision of pregnancy with insufficient antenatal care, unspecified trimester: Secondary | ICD-10-CM

## 2011-12-28 DIAGNOSIS — O34219 Maternal care for unspecified type scar from previous cesarean delivery: Secondary | ICD-10-CM

## 2011-12-28 DIAGNOSIS — D696 Thrombocytopenia, unspecified: Secondary | ICD-10-CM

## 2011-12-28 MED ORDER — IBUPROFEN 600 MG PO TABS: 600.0000 mg | ORAL_TABLET | Freq: Four times a day (QID) | ORAL | Status: AC | PRN

## 2011-12-28 MED ORDER — OXYCODONE-ACETAMINOPHEN 10-325 MG PO TABS: 1.0000 | ORAL_TABLET | Freq: Four times a day (QID) | ORAL | Status: AC | PRN

## 2011-12-28 NOTE — Progress Notes (Signed)
VASCULAR LAB PRELIMINARY  PRELIMINARY  PRELIMINARY  PRELIMINARY  Right lower extremity venous duplex completed.    Preliminary report:  Right:  No evidence of DVT, superficial thrombosis, or Baker's cyst.  Mild interstitial fluid noted.  Terance Hart, RVT 12/28/2011, 11:22 AM

## 2011-12-28 NOTE — Discharge Summary (Signed)
Obstetric Discharge Summary Reason for Admission: induction of labor Prenatal Procedures: NST and ultrasound Intrapartum Procedures: cesarean: low cervical, transverse Postpartum Procedures: none Complications-Operative and Postpartum: Chorioamnionitis Hemoglobin  Date Value Range Status  12/26/2011 9.8* 12.0-15.0 (g/dL) Final     HCT  Date Value Range Status  12/26/2011 30.4* 36.0-46.0 (%) Final    Physical Exam:  General: alert, cooperative and no distress Lochia: appropriate Uterine Fundus: firm Incision: healing well DVT Evaluation: Posterior calf tenderness present, right Calf/Ankle edema is present, right Lower Ext Veinous doppler of right: Negative for DVT  Discharge Diagnoses: Failed TOLAC, RLTCS for Chorio/FTP/Maternal Request  Discharge Information: Date: 12/28/2011 Activity: pelvic rest Diet: routine Medications: Ibuprofen and Percocet Condition: stable Instructions: refer to practice specific booklet Discharge to: home Follow-up Information    Follow up with [facility] E., CNM in 2 weeks. (Clinic staff will call with the date/time of your appt.)    Contact information:   [address] [place] [address] [phone]          Newborn Data: Live born female  Birth Weight: 7 lb 0.3 oz (3184 g) APGAR: 7, 8  Home with NICU. Baby stable in NICU on 5-7 day IV ABX for sepsis  Heather Kuri E. 12/28/2011, 11:55 AM

## 2011-12-28 NOTE — Progress Notes (Signed)
Clinical Social Work Department PSYCHOSOCIAL ASSESSMENT - MATERNAL/CHILD 12/28/2011  Patient:  DAVEIGH, BATTY  Account Number:  [id number]  Admit Date:  12/24/2011  Marjo Bicker Name:   Burman Foster    Clinical Social Worker:  Lulu Riding, LCSW   Date/Time:  12/28/2011 11:26 AM  Date Referred:  12/25/2011   Referral source  NICU  CN     Referred reason  NICU  [facility]   Other referral source:    I:  FAMILY / HOME ENVIRONMENT Child's legal guardian:  PARENT  Guardian - Name Guardian - Age Guardian - Address  Yuli Orgeron 35 [address] [place], [place] [address]  Kallie Edward  same   Other household support members/support persons Name Relationship DOB  Noon SISTER 7  Kandis Mannan BROTHER 5   Other support:   FOB's sister is caring for the children while parents are in the hospital.  They report having good supports.    II  PSYCHOSOCIAL DATA Information Source:  Family Interview  Event organiser Employment:   FOB is employed   Surveyor, quantity resources:  [employer] If [employer] - County:  [employer] / Grade:   Maternity Care Coordinator / Child Services Coordination / Early Interventions:  Cultural issues impacting care:   Parents are originally from [place] and are Muslim    III  STRENGTHS Strengths  Supportive family/friends  Home prepared for Child (including basic supplies)  Compliance with medical plan  Adequate Resources   Strength comment:    IV  RISK FACTORS AND CURRENT PROBLEMS Current Problem:  None   Risk Factor & Current Problem Patient Issue Family Issue Risk Factor / Current Problem Comment   N N     V  SOCIAL WORK ASSESSMENT SW met with parents today to introduce myself, complete assessment and evaluate how they are coping with baby's admission to NICU.  MOB seemed sad, but was pleasant as usual.  SW discussed common emotions related to a NICU admission and having to discharge before baby.   FOB did most of the talking and states he has not  really been told much about what is going on with the baby.  They would like to speak to a doctor before MOB's discharge today.  SW informed Dr. Ransom/Neonatalogist of this and he states that he will speak to them.  They report having all needed baby supplies at home and no issues with transportation. MOB has a [facility] appointment on 4/10.  SW asked about her [facility] and she states that she started [facility] at the beginning of her pregnancy in [place] and moved here in February.  FOB states he has traveled back and forth for the past 5 years and now she will be staying here.  She smiled and said she was happy to be here.  They state no other questions or needs at this time.  SW explained support services offered by NICU SW and they thanked SW for speaking with them.      VI SOCIAL WORK PLAN Social Work Plan  Psychosocial Support/Ongoing Assessment of Needs   Type of pt/family education:   If child protective services report - county:   If child protective services report - date:   Information/referral to community resources comment:   Other social work plan:

## 2011-12-28 NOTE — Discharge Instructions (Signed)

## 2011-12-28 NOTE — Progress Notes (Signed)
UR Chart review completed.  

## 2011-12-28 NOTE — Progress Notes (Signed)
Subjective: Postpartum Day 3: Cesarean Delivery Patient reports: increasing pain and swelling in right lower calf and foot  Objective: Vital signs in last 24 hours: Temp:  [97.8 F (36.6 C)-98 F (36.7 C)] 97.8 F (36.6 C) (04/05 0629) Pulse Rate:  [67-77] 73  (04/05 0629) Resp:  [18] 18  (04/05 0629) BP: (105-118)/(51-80) 110/75 mmHg (04/05 0629)  Physical Exam:  General: alert, cooperative and no distress Lochia: appropriate Uterine Fundus: firm Incision: healing well DVT Evaluation: Posterior calf tenderness present. Calf/Ankle edema is present.   Basename 12/26/11 0525 12/25/11 1540  HGB 9.8* 11.1*  HCT 30.4* 33.7*    Assessment/Plan: Status post Cesarean section. Doing well postoperatively. r/o DVT of right LE: Will obtain veinous doppler flow today. If neg, will discharge home this afternoon.  Continue current care.  Neema Fluegge E. 12/28/2011, 11:41 AM

## 2012-01-06 ENCOUNTER — Inpatient Hospital Stay (HOSPITAL_COMMUNITY)
Admission: AD | Admit: 2012-01-06 | Discharge: 2012-01-07 | DRG: 776 | Disposition: A | Discharge status: Home / Self Care | Payer: Medicaid Other | Source: Ambulatory Visit | Attending: Obstetrics & Gynecology | Admitting: Obstetrics & Gynecology

## 2012-01-06 ENCOUNTER — Encounter (HOSPITAL_COMMUNITY): Payer: Self-pay | Admitting: *Deleted

## 2012-01-06 DIAGNOSIS — O8600 Infection of obstetric surgical wound, unspecified: Secondary | ICD-10-CM | POA: Diagnosis present

## 2012-01-06 DIAGNOSIS — O909 Complication of the puerperium, unspecified: Principal | ICD-10-CM | POA: Diagnosis present

## 2012-01-06 DIAGNOSIS — L02219 Cutaneous abscess of trunk, unspecified: Secondary | ICD-10-CM | POA: Diagnosis present

## 2012-01-06 LAB — URINALYSIS, ROUTINE W REFLEX MICROSCOPIC
Bilirubin Urine: NEGATIVE
Glucose, UA: NEGATIVE mg/dL
Protein, ur: NEGATIVE mg/dL
Specific Gravity, Urine: <1.005 — ABNORMAL LOW (ref 1.005–1.030)
Urobilinogen, UA: 0.2 mg/dL (ref 0.0–1.0)
pH: 6 (ref 5.0–8.0)

## 2012-01-06 LAB — CBC
Platelets: 251 10*3/uL (ref 150–400)
RBC: 4.37 MIL/uL (ref 3.87–5.11)
WBC: 12.5 10*3/uL — ABNORMAL HIGH (ref 4.0–10.5)

## 2012-01-06 LAB — URINE MICROSCOPIC-ADD ON

## 2012-01-06 MED ORDER — HYDROCORTISONE ACE-PRAMOXINE 1-1 % RE FOAM
1.0000 | Freq: Two times a day (BID) | RECTAL | Status: DC
  Administered 2012-01-06 – 2012-01-07 (×2): 1 via RECTAL
  Filled 2012-01-06 (×2): qty 10

## 2012-01-06 MED ORDER — OXYCODONE-ACETAMINOPHEN 10-325 MG PO TABS: 1.0000 | ORAL_TABLET | Freq: Four times a day (QID) | ORAL | Status: DC | PRN

## 2012-01-06 MED ORDER — VANCOMYCIN HCL IN DEXTROSE 1-5 GM/200ML-% IV SOLN
1000.0000 mg | Freq: Three times a day (TID) | INTRAVENOUS | Status: DC
  Administered 2012-01-06 – 2012-01-07 (×4): 1000 mg via INTRAVENOUS
  Filled 2012-01-06 (×5): qty 200

## 2012-01-06 MED ORDER — OXYCODONE-ACETAMINOPHEN 5-325 MG PO TABS
2.0000 | ORAL_TABLET | Freq: Four times a day (QID) | ORAL | Status: DC | PRN
  Administered 2012-01-06: 1 via ORAL
  Administered 2012-01-07 (×3): 2 via ORAL
  Filled 2012-01-06: qty 1
  Filled 2012-01-06 (×3): qty 2

## 2012-01-06 MED ORDER — WITCH HAZEL-GLYCERIN EX PADS
MEDICATED_PAD | CUTANEOUS | Status: DC | PRN
  Administered 2012-01-06: 21:00:00 via TOPICAL

## 2012-01-06 MED ORDER — IBUPROFEN 600 MG PO TABS
600.0000 mg | ORAL_TABLET | Freq: Four times a day (QID) | ORAL | Status: DC | PRN
  Administered 2012-01-06 – 2012-01-07 (×2): 600 mg via ORAL
  Filled 2012-01-06 (×2): qty 1

## 2012-01-06 NOTE — MAU Note (Signed)
Pt had c-section delivery on 4/2. Pt reports having  bloodydrainage for incision that started 2-3 days ago. Pt reports  having increased abd pain pain medication is helping.

## 2012-01-06 NOTE — H&P (Signed)
  Chief Complaint   Patient presents with   .  Wound Check    HPI  Pt is S/P LTCS of 10/27/11 in with c/o pain and foul smelling drainage from incision. Denies fever.  OB History    Grav  Para  Term  Preterm  Abortions  TAB  SAB  Ect  Mult  Living    3  3  3   0  0  0  0  0  0  3      Past Medical History   Diagnosis  Date   .  No pertinent past medical history      thrombocytopenia    Past Surgical History   Procedure  Date   .  Cesarean section  June 2008     Breech presentation   .  Cesarean section  12/25/2011     Procedure: CESAREAN SECTION; Surgeon: Tereso Newcomer, MD; Location: [facility]; Service: Gynecology; Laterality: N/A; Repeat Cesarean Section Delivery Girl @ 0413, Apgars 7/8    Family History   Problem  Relation  Age of Onset   .  Diabetes  Brother     History   Substance Use Topics   .  Smoking status:  Never Smoker   .  Smokeless tobacco:  Never Used   .  Alcohol Use:  No    Allergies: No Known Allergies  Prescriptions prior to admission   Medication  Sig  Dispense  Refill   .  ibuprofen (ADVIL,MOTRIN) 600 MG tablet  Take 1 tablet (600 mg total) by mouth every 6 (six) hours as needed for pain.  40 tablet  0   .  oxyCODONE-acetaminophen (PERCOCET) 10-325 MG per tablet  Take 1 tablet by mouth every 6 (six) hours as needed for pain.  48 tablet  0    Review of Systems  Constitutional: Positive for malaise/fatigue.  HENT: Negative.  Eyes: Negative.  Respiratory: Negative.  Cardiovascular: Negative.  Gastrointestinal: Positive for abdominal pain.  Genitourinary: Negative.  Musculoskeletal: Negative.  Neurological: Negative.  Endo/Heme/Allergies: Negative.  Psychiatric/Behavioral: Negative.   Physical Exam   Blood pressure 128/80, pulse 86, temperature 97.6 F (36.4 C), temperature source Oral, resp. rate 18, height 5\' 2"  (1.575 m), weight 205 lb 3.2 oz (93.078 kg), last menstrual period 03/08/2011, unknown if currently breastfeeding.  Physical Exam    Constitutional: She is oriented to person, place, and time. She appears well-developed and well-nourished.  HENT:  Head: Normocephalic.  Cardiovascular: Normal rate, regular rhythm, normal heart sounds and intact distal pulses.  Respiratory: Effort normal and breath sounds normal.  GI: Soft.  Incision draining purulent yellow drainage, area above L side of incision red, firm to touch and tender.  Musculoskeletal: Normal range of motion.  Neurological: She is alert and oriented to person, place, and time. She has normal reflexes.  Skin: Skin is warm and dry.  Psychiatric: She has a normal mood and affect. Her behavior is normal. Judgment and thought content normal.   MAU Course   Procedures  MDM  Assessment and Plan   Area of incision on L side firm ,warm to touch, red inflamed above incision, thick yellow purulent discharge from l side of incision. Steri strips removed and incision cleaned with peroxide, culture obtained. Dr. Penne Lash notified of findings.  Heather Hawkins DARLENE  01/06/2012, 3:49 PM

## 2012-01-06 NOTE — MAU Provider Note (Signed)
  History     CSN: [id number]  Arrival date and time: 01/06/12 1508   None     Chief Complaint  Patient presents with  . Wound Check   HPI Pt is S/P LTCS of 10/27/11 in with c/o pain and foul smelling drainage from incision. Denies fever.  OB History    Grav Para Term Preterm Abortions TAB SAB Ect Mult Living   3 3 3  0 0 0 0 0 0 3      Past Medical History  Diagnosis Date  . No pertinent past medical history     thrombocytopenia    Past Surgical History  Procedure Date  . Cesarean section June 2008    Breech presentation  . Cesarean section 12/25/2011    Procedure: CESAREAN SECTION;  Surgeon: Tereso Newcomer, MD;  Location: [facility];  Service: Gynecology;  Laterality: N/A;  Repeat Cesarean Section Delivery Girl @ 0413, Apgars 7/8    Family History  Problem Relation Age of Onset  . Diabetes Brother     History  Substance Use Topics  . Smoking status: Never Smoker   . Smokeless tobacco: Never Used  . Alcohol Use: No    Allergies: No Known Allergies  Prescriptions prior to admission  Medication Sig Dispense Refill  . ibuprofen (ADVIL,MOTRIN) 600 MG tablet Take 1 tablet (600 mg total) by mouth every 6 (six) hours as needed for pain.  40 tablet  0  . oxyCODONE-acetaminophen (PERCOCET) 10-325 MG per tablet Take 1 tablet by mouth every 6 (six) hours as needed for pain.  48 tablet  0    Review of Systems  Constitutional: Positive for malaise/fatigue.  HENT: Negative.   Eyes: Negative.   Respiratory: Negative.   Cardiovascular: Negative.   Gastrointestinal: Positive for abdominal pain.  Genitourinary: Negative.   Musculoskeletal: Negative.   Neurological: Negative.   Endo/Heme/Allergies: Negative.   Psychiatric/Behavioral: Negative.    Physical Exam   Blood pressure 128/80, pulse 86, temperature 97.6 F (36.4 C), temperature source Oral, resp. rate 18, height 5\' 2"  (1.575 m), weight 205 lb 3.2 oz (93.078 kg), last menstrual period 03/08/2011, unknown if  currently breastfeeding.  Physical Exam  Constitutional: She is oriented to person, place, and time. She appears well-developed and well-nourished.  HENT:  Head: Normocephalic.  Cardiovascular: Normal rate, regular rhythm, normal heart sounds and intact distal pulses.   Respiratory: Effort normal and breath sounds normal.  GI: Soft.       Incision draining purulent yellow drainage, area above L side of incision red, firm to touch and tender.  Musculoskeletal: Normal range of motion.  Neurological: She is alert and oriented to person, place, and time. She has normal reflexes.  Skin: Skin is warm and dry.  Psychiatric: She has a normal mood and affect. Her behavior is normal. Judgment and thought content normal.    MAU Course  Procedures  MDM   Assessment and Plan  Area of incision on L side firm ,warm to touch, red inflamed above incision, thick yellow purulent  discharge from l side of incision. Steri strips removed and incision cleaned with peroxide, culture obtained. Dr. Penne Lash notified of findings.  Heather Hawkins DARLENE 01/06/2012, 3:49 PM

## 2012-01-06 NOTE — H&P (Signed)
Pt seen and examined.  Sub centimeter wound opening with purulent drainage.  7 cm area of induration pannus cellulitis.  Will admit for abx.

## 2012-01-06 NOTE — Progress Notes (Signed)
ANTIBIOTIC CONSULT NOTE - INITIAL  Pharmacy Consult for  Vancomycin Indication: Wound infection  No Known Allergies  Patient Measurements: Height: 5\' 2"  (157.5 cm) Weight: 205 lb 3.2 oz (93.078 kg) IBW/kg (Calculated) : 50.1   Vital Signs: Temp: 98.8 F (37.1 C) (04/14 1700) Temp src: Oral (04/14 1700) BP: 120/81 mmHg (04/14 1700) Pulse Rate: 68  (04/14 1700)   Labs:  Basename 01/06/12 1637  WBC 12.5*  HGB 12.4  PLT 251  LABCREA --  CREATININE --   Estimated Creatinine Clearance: 104.3 ml/min (by C-G formula based on Cr of 0.57).   Microbiology: Recent Results (from the past 720 hour(s))  CULTURE, OB URINE     Status: Normal   Collection Time   12/19/11 11:58 AM      Component Value Range Status Comment   Colony Count 20,OOO COLONIES/ML   Final    Organism ID, Bacteria GROUP B STREP (S.AGALACTIAE) ISOLATED   Final     Medical History: Past Medical History  Diagnosis Date  . No pertinent past medical history     thrombocytopenia    Medications: Ibuprofen 600mg  po Q 6 prn for pain.  Assessment: Pt. Is S/P LTCS 12/25/11 and presents with yellow, pruelent drainage at wound site.  Will treat for wound infection.  Goal of Therapy: Trough ~10-15 mg/L  Plan: Start Vancomycin 1g IV Q 8 hours. Will check trough as appropriate.  Thank you,  Hovey-Rankin, Heberto Sturdevant 01/06/2012,8:09 PM

## 2012-01-06 NOTE — MAU Provider Note (Signed)
Pt seen and examined. Medical Screening exam and patient care preformed by advanced practice provider.  Agree with the above management.

## 2012-01-07 DIAGNOSIS — O8600 Infection of obstetric surgical wound, unspecified: Secondary | ICD-10-CM | POA: Diagnosis present

## 2012-01-07 DIAGNOSIS — O909 Complication of the puerperium, unspecified: Secondary | ICD-10-CM

## 2012-01-07 DIAGNOSIS — L02219 Cutaneous abscess of trunk, unspecified: Secondary | ICD-10-CM

## 2012-01-07 DIAGNOSIS — L03319 Cellulitis of trunk, unspecified: Secondary | ICD-10-CM

## 2012-01-07 LAB — URINALYSIS, ROUTINE W REFLEX MICROSCOPIC
Ketones, ur: NEGATIVE mg/dL
Nitrite: NEGATIVE
Protein, ur: 30 mg/dL — AB
pH: 5.5 (ref 5.0–8.0)

## 2012-01-07 LAB — URINE MICROSCOPIC-ADD ON

## 2012-01-07 MED ORDER — SULFAMETHOXAZOLE-TRIMETHOPRIM 800-160 MG PO TABS: 1.0000 | ORAL_TABLET | Freq: Two times a day (BID) | ORAL | Status: AC

## 2012-01-07 NOTE — Progress Notes (Signed)
DC instructions/Med Rec reviewed with patient and husband. Called Dr. Christy Gentles to ask if she could call in proctofoam which is what pt prefers. DC IV per Porsche NT. Pt has no complaints nor concerns.

## 2012-01-07 NOTE — Progress Notes (Signed)
Patient ID: Heather Hawkins, female   DOB: 1976/08/03, 36 y.o.   MRN: [id number] Patient reports feeling well. Incisional pain well controlled with pain medication. Patient desires to be discharged this evening.  Filed Vitals:   01/07/12 1400  BP: 117/68  Pulse: 71  Temp: 98.1 F (36.7 C)  Resp: 18   Abd: s/nt/nd Incision: healing well, Area of erythema and induration now 1.5cm away from demarcated area. Purulent drainage expressible from less than 0.5 cm opening Ext: NT, equal in size  A/P 36 yo G3P3 POD#14 s/p c-section with wound infection and cellulitis - Patient afebrile for 20 hr and desires discharge home this evening - Plan to discharge following completion of 2030 vancomycin dose - discharge instuctions provided - patient to follow-up in clinic for wound check

## 2012-01-07 NOTE — Discharge Summary (Signed)
Physician Discharge Summary  Patient ID: Heather Hawkins MRN: [id number] DOB/AGE: 1976/02/17 35 y.o.  Admit date: 01/06/2012 Discharge date: 01/07/2012  Admission Diagnoses: Post operative wound infection and cellulitis  Discharge Diagnoses: same Active Problems:  Cesarean wound infection   Discharged Condition: good  Hospital Course: patient admitted with wound infection POD#14 s/p c-section. Patient had a fever of 100.5 on admission and remained afebrile throughout her hospitalization. She was treated with IV antibiotics and requested to go home after 24 hours of observation. Patient discharged following evening dose of Vancomycin and to complete a 14-day course of Bactrim DS. Patient instructed to return if febrile or incision appearance worsen.  Consults: None  Significant Diagnostic Studies:   Treatments: antibiotics: vancomycin  Discharge Exam: Blood pressure 117/68, pulse 71, temperature 98.1 F (36.7 C), temperature source Oral, resp. rate 18, height 5\' 2"  (1.575 m), weight 93.078 kg (205 lb 3.2 oz), last menstrual period 03/08/2011, SpO2 97.00%, currently breastfeeding. General appearance: alert, cooperative and no distress GI: abnormal findings:  incision healing well with area of erythema receding from demarcated line, small amount of purulent discharge can be expressed from a less than 0.5 cm skin defect Extremities: Homans sign is negative, no sign of DVT and no edema, redness or tenderness in the calves or thighs  Disposition: 01-Home or Self Care  Discharge Orders    Future Appointments: Provider: Department: Dept Phone: Center:   01/14/2012 2:15 PM [facility] [phone] [facility]   01/23/2012 1:30 PM August Luz, CNM [facility] [phone] [facility]     Medication List  As of 01/07/2012  5:00 PM   TAKE these medications         docusate sodium 100 MG capsule   Commonly known as: COLACE   Take 100 mg by mouth 2 (two) times daily. For  constipation      ibuprofen 600 MG tablet   Commonly known as: ADVIL,MOTRIN   Take 1 tablet (600 mg total) by mouth every 6 (six) hours as needed for pain.      oxyCODONE-acetaminophen 10-325 MG per tablet   Commonly known as: PERCOCET   Take 1 tablet by mouth every 6 (six) hours as needed for pain.      prenatal multivitamin Tabs   Take 1 tablet by mouth daily.      sulfamethoxazole-trimethoprim 800-160 MG per tablet   Commonly known as: BACTRIM DS,SEPTRA DS   Take 1 tablet by mouth 2 (two) times daily.      witch hazel-glycerin pad   Commonly known as: TUCKS   Place 1 application rectally as needed. For hemorrhoids.             Signed: Tashira Torre 01/07/2012, 5:00 PM

## 2012-01-07 NOTE — Progress Notes (Addendum)
Subjective: Patient reports pain with urination.   Objective: I have reviewed patient's vital signs, medications, labs and microbiology.  General: alert, cooperative and no distress GI: soft, non-tender; bowel sounds normal; no masses,  no organomegaly Extremities: Homans sign is negative, no sign of DVT Vaginal Bleeding: none Incision--decreased induration by 3/4 cm.  Less erythematous.  Purulent drainage from sub centimeter area; dressing to be changed  Assessment/Plan: 36 yo female with cellulitis and dysuria  1.  Cont Vanc 2.Ua, U cx 3. Re-evaluate at 6 pm for discharge.   LOS: 1 day    Brittnee Gaetano H. 01/07/2012, 7:38 AM

## 2012-01-07 NOTE — Discharge Instructions (Signed)
Wound Infection  A wound infection happens when a type of germ (bacteria) starts growing in the wound. In some cases, this can cause the wound to break open. If cared for properly, the infected wound will heal from the inside to the outside. Wound infections need treatment.  CAUSES  An infection is caused by bacteria growing in the wound.   SYMPTOMS    Increase in redness, swelling, or pain at the wound site.   Increase in drainage at the wound site.   Wound or bandage (dressing) starts to smell bad.   Fever.   Feeling tired or fatigued.   Pus draining from the wound.  TREATMENT   You caregiver will prescribe antibiotic medicine. The wound infection should improve within 24 to 48 hours. Any redness around the wound should stop spreading and the wound should be less painful.   HOME CARE INSTRUCTIONS    Only take over-the-counter or prescription medicines for pain, discomfort, or fever as directed by your caregiver.   Take your antibiotics as directed. Finish them even if you start to feel better.   Gently wash the area with mild soap and water 2 times a day, or as directed. Rinse off the soap. Pat the area dry with a clean towel. Do not rub the wound. This may cause bleeding.   Follow your caregiver's instructions for how often you need to change the dressing.   Apply ointment and a dressing to the wound as directed.   If the dressing sticks, moisten it with soapy water and gently remove it.   Change the bandage right away if it becomes wet, dirty, or develops a bad smell.   Take showers. Do not take tub baths, swim, or do anything that may soak the wound until it is healed.   Avoid exercises that make you sweat heavily.   Use anti-itch medicine as directed by your caregiver. The wound may itch when it is healing. Do not pick or scratch at the wound.   Follow up with your caregiver to get your wound rechecked as directed.  SEEK MEDICAL CARE IF:   You have an increase in swelling, pain, or redness  around the wound.   You have an increase in the amount of pus coming from the wound.   There is a bad smell coming from the wound.   More of the wound breaks open.   You have a fever.  MAKE SURE YOU:    Understand these instructions.   Will watch your condition.   Will get help right away if you are not doing well or get worse.  Document Released: 06/09/2003 Document Revised: 08/30/2011 Document Reviewed: 01/14/2011  Exit[employer] Patient Information 2012 [employer].

## 2012-01-08 LAB — URINE CULTURE: Culture  Setup Time: 201304151656

## 2012-01-08 LAB — WOUND CULTURE

## 2012-01-14 ENCOUNTER — Encounter: Payer: Self-pay | Admitting: Physician Assistant

## 2012-01-14 ENCOUNTER — Ambulatory Visit: Payer: Medicaid Other | Admitting: Physician Assistant

## 2012-01-14 VITALS — BP 118/79 | HR 85 | Temp 96.9°F | Ht 63.0 in | Wt 202.2 lb

## 2012-01-14 DIAGNOSIS — O8600 Infection of obstetric surgical wound, unspecified: Secondary | ICD-10-CM

## 2012-01-14 NOTE — Progress Notes (Unsigned)
Patient ID: Heather Hawkins, female   DOB: 04-Oct-1975, 36 y.o.   MRN: [id number]  36 y.o. G3P3003 at 3 weeks postpartum here for follow up for c/s wound infection. Admitted on 4/14 with wound infection, received Vancomycin IV, d/c'd home on Bactrim. Has completed 1 week of Bactrim, has 1 more week prescribed. States sometimes incision feels better, sometimes feels worse. Continues to drain yellowish discharge.   Past Medical History  Diagnosis Date  . No pertinent past medical history     thrombocytopenia   No Known Allergies  Review of Systems  Constitutional: Negative.  Negative for fever and chills.  HENT: Negative.   Respiratory: Negative.   Cardiovascular: Negative.   Gastrointestinal: Negative.   Genitourinary: Negative.   Neurological: Negative.   Psychiatric/Behavioral: Negative.    Physical Exam  Nursing note and vitals reviewed. Constitutional: She is oriented to person, place, and time. She appears well-developed and well-nourished. No distress.  Cardiovascular: Normal rate.   Pulmonary/Chest: Effort normal.  Abdominal:    Musculoskeletal: Normal range of motion.  Neurological: She is alert and oriented to person, place, and time.  Skin: Skin is warm and dry.  Psychiatric: She has a normal mood and affect.   A/P: 36 y.o. G3P3003 at 3 weeks PP with c/s wound infection Incision evaluated by Dr. Jolayne Panther who discharged patient on 4/14, states that wound looks better at this time than it did at time of discharge Continue Bactrim as prescribed CT ordered to eval area of infection F/U 1 week or sooner PRN

## 2012-01-14 NOTE — Patient Instructions (Signed)
Wound Infection °A wound infection happens when a type of germ (bacteria) grows in a wound. Caring for the infection can help the wound heal. Wound infections need treatment. °HOME CARE  °· Only take medicine as told by your doctor.  °· Take your antibiotic medicine as told. Finish it even if you start to feel better.  °· Clean the wound with mild soap and water as told. Rinse the soap off. Pat the area dry with a clean towel. Do not rub the wound.  °· Change any bandages (dressings) as told by your doctor.  °· Put cream and a bandage on the wound as told by your doctor.  °· If the bandage sticks, wet it with soapy water to remove the bandage.  °· Change the bandage if it gets wet, dirty, or starts to smell.  °· Take showers. Do not take baths, swim, or do anything that puts your wound under water.  °· Avoid exercise that makes you sweat.  °· If your wound itches, use a medicine that helps stop itching. Do not pick or scratch at the wound.  °· Keep all doctor visits as told.  °GET HELP RIGHT AWAY IF:  °· You have more puffiness (swelling), pain, or redness around the wound.  °· You have more yellowish-white fluid (pus) coming from the wound.  °· You have a bad smell coming from the wound.  °· Your wound breaks open more.  °· You have a fever.  °MAKE SURE YOU:  °· Understand these instructions.  °· Will watch your condition.  °· Will get help right away if you are not doing well or get worse.  °Document Released: 06/19/2008 Document Revised: 08/30/2011 Document Reviewed: 02/19/2011 °Exit[employer]® Patient Information ©2012 [employer]. °

## 2012-01-15 ENCOUNTER — Ambulatory Visit (HOSPITAL_COMMUNITY)
Admission: RE | Admit: 2012-01-15 | Discharge: 2012-01-15 | Disposition: A | Discharge status: Home / Self Care | Payer: Medicaid Other | Source: Ambulatory Visit | Attending: Advanced Practice Midwife | Admitting: Advanced Practice Midwife

## 2012-01-15 ENCOUNTER — Other Ambulatory Visit: Payer: Self-pay | Admitting: Advanced Practice Midwife

## 2012-01-15 DIAGNOSIS — O8600 Infection of obstetric surgical wound, unspecified: Secondary | ICD-10-CM

## 2012-01-15 DIAGNOSIS — O909 Complication of the puerperium, unspecified: Secondary | ICD-10-CM | POA: Insufficient documentation

## 2012-01-15 MED ORDER — IOHEXOL 300 MG/ML  SOLN
100.0000 mL | Freq: Once | INTRAMUSCULAR | Status: AC | PRN
  Administered 2012-01-15: 100 mL via INTRAVENOUS

## 2012-01-15 NOTE — Progress Notes (Signed)
UR Chart review completed.  

## 2012-01-21 ENCOUNTER — Ambulatory Visit: Payer: Medicaid Other | Admitting: Physician Assistant

## 2012-01-21 MED ORDER — HYDROCORTISONE ACE-PRAMOXINE 1-1 % RE FOAM: 1.0000 | Freq: Two times a day (BID) | RECTAL | Status: AC

## 2012-01-21 MED ORDER — IBUPROFEN 800 MG PO TABS: 600.0000 mg | ORAL_TABLET | Freq: Four times a day (QID) | ORAL | Status: DC | PRN

## 2012-01-21 MED ORDER — OXYCODONE-ACETAMINOPHEN 10-325 MG PO TABS: 1.0000 | ORAL_TABLET | ORAL | Status: DC | PRN

## 2012-01-21 MED ORDER — NORETHINDRONE 0.35 MG PO TABS: 1.0000 | ORAL_TABLET | Freq: Every day | ORAL | Status: DC

## 2012-01-21 NOTE — Patient Instructions (Signed)

## 2012-01-23 ENCOUNTER — Ambulatory Visit: Payer: Medicaid Other | Admitting: Physician Assistant

## 2012-02-13 ENCOUNTER — Ambulatory Visit (INDEPENDENT_AMBULATORY_CARE_PROVIDER_SITE_OTHER): Payer: Medicaid Other | Admitting: Advanced Practice Midwife

## 2012-02-13 ENCOUNTER — Encounter: Payer: Self-pay | Admitting: Advanced Practice Midwife

## 2012-02-13 DIAGNOSIS — K649 Unspecified hemorrhoids: Secondary | ICD-10-CM

## 2012-02-13 LAB — POCT URINALYSIS DIP (DEVICE)
Ketones, ur: NEGATIVE mg/dL
Protein, ur: NEGATIVE mg/dL
Specific Gravity, Urine: 1.02 (ref 1.005–1.030)
Urobilinogen, UA: 0.2 mg/dL (ref 0.0–1.0)
pH: 5.5 (ref 5.0–8.0)

## 2012-02-13 NOTE — Progress Notes (Signed)
  Subjective:     Heather Hawkins is a 36 y.o. female who presents for a postpartum visit. She is 7 weeks postpartum following a low cervical transverse Cesarean section for FTP. I have fully reviewed the prenatal and intrapartum course. The delivery was at 41.5 gestational weeks. Outcome: repeat cesarean section, low transverse incision. Anesthesia: epidural. Postpartum course has been complicated by hospitalization for wound infection and hemorrhoids. Baby's course has been normal. Baby is feeding by breast. Bleeding no bleeding. Bowel function is normal. Bladder function is normal. Patient is not sexually active. Contraception method is Pharmacist, hospital. Postpartum depression screening: negative.  The following portions of the patient's history were reviewed and updated as appropriate: allergies, current medications, past family history, past medical history, past social history, past surgical history and problem list.  Review of Systems Pertinent items are noted in HPI.   Objective:    BP 114/69  Pulse 78  Temp(Src) 97.9 F (36.6 C) (Oral)  Ht 5\' 3"  (1.6 m)  Wt 91.808 kg (202 lb 6.4 oz)  BMI 35.85 kg/m2  Breastfeeding? Yes  General:  alert, cooperative and no distress   Breasts:  inspection negative, no nipple discharge or bleeding, no masses or nodularity palpable  Lungs: clear to auscultation bilaterally  Heart:  regular rate and rhythm, S1, S2 normal, no murmur, click, rub or gallop  Abdomen: soft, non-tender; bowel sounds normal; no masses,  no organomegaly and low transverse incision well-healed. No drainage. 3 cm NT mass palpated 7 cm above incision. No erythema or warmth. Likely scar tissue   Vulva:  not evaluated  Vagina: not evaluated  Cervix:  not evaluated  Corpus: below SP  Adnexa:  not evaluated  Rectal Exam: external hemorrhoids        Assessment:   1. Postpartum care and examination   2. Hemorrhoid   3.  Incision infection resolved Plan:   1. Contraception: Micronor 2.  F/U w/ PCP or general surgeon for management of Hemorrhoids if not improved in 1 month. 3. Follow up in: 1 year or as needed.  4. Discussed LARC and importance of spacing births at least 2 years apart after C/S.  Katrinka Blazing, Koleen Nimrod 02/13/2012

## 2013-05-09 IMAGING — US US OB COMP +14 WK
1 series · 12 of 28 positions shown · non-contrast
Comparison: none

[Series 1: us ob comp +14 wk · 66 acquisitions, 12 frames shown]
[im 3/66]
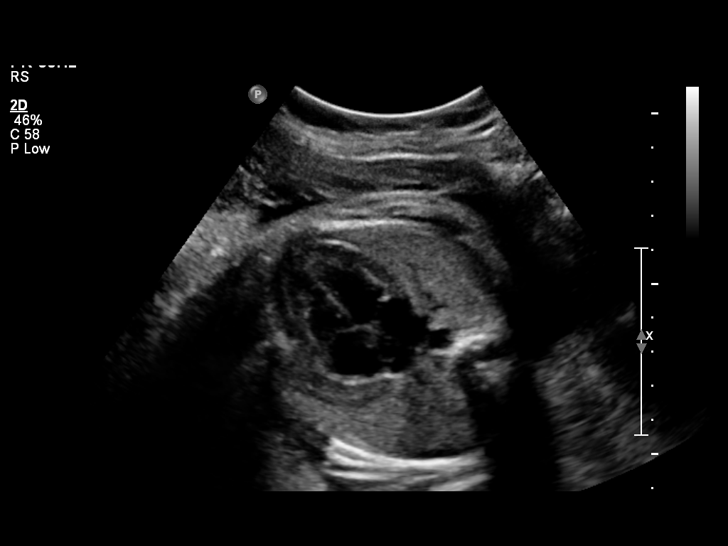
[im 8/66]
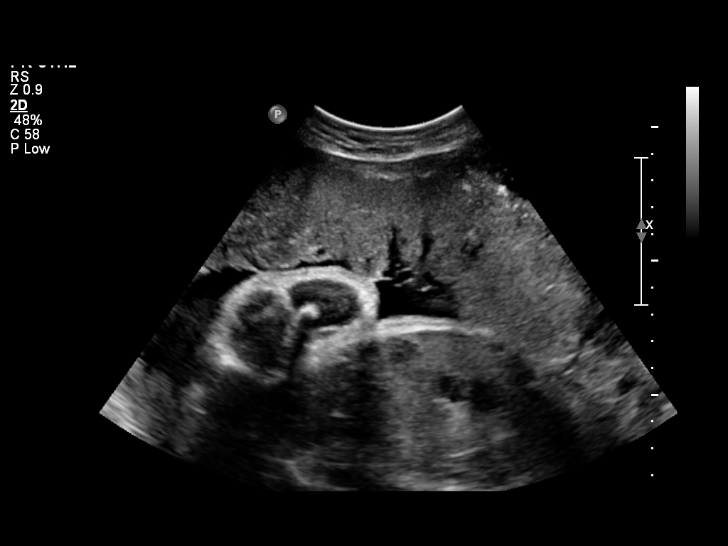
[im 13/66]
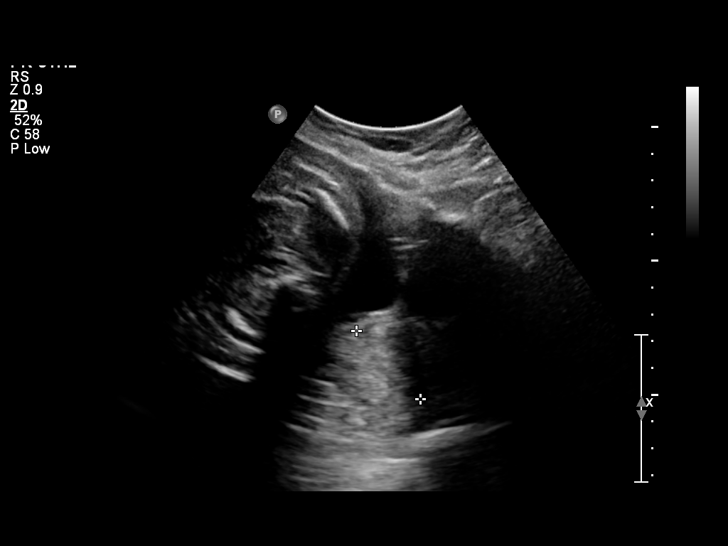
[im 20/66]
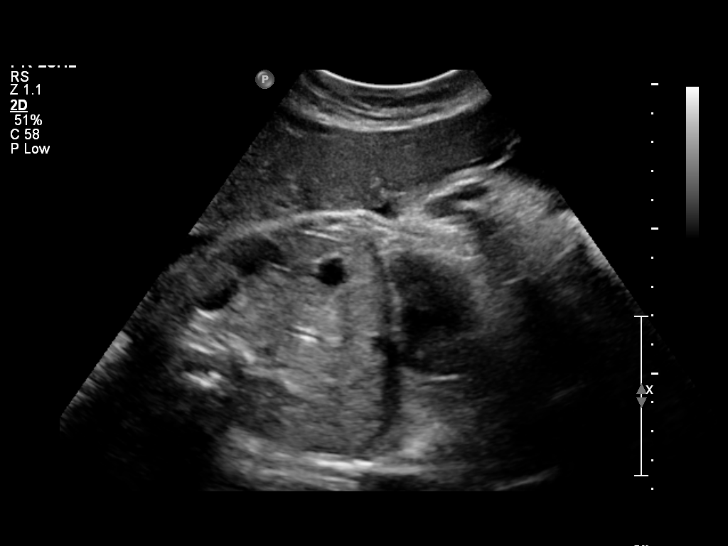
[im 25/66]
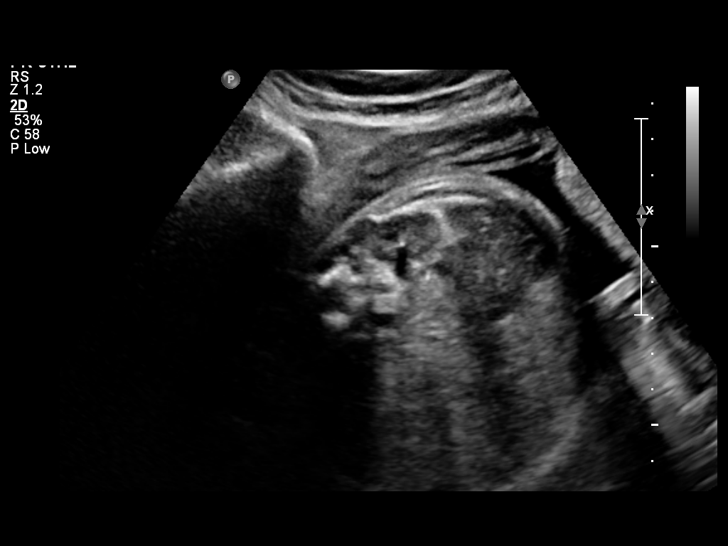
[im 29/66]
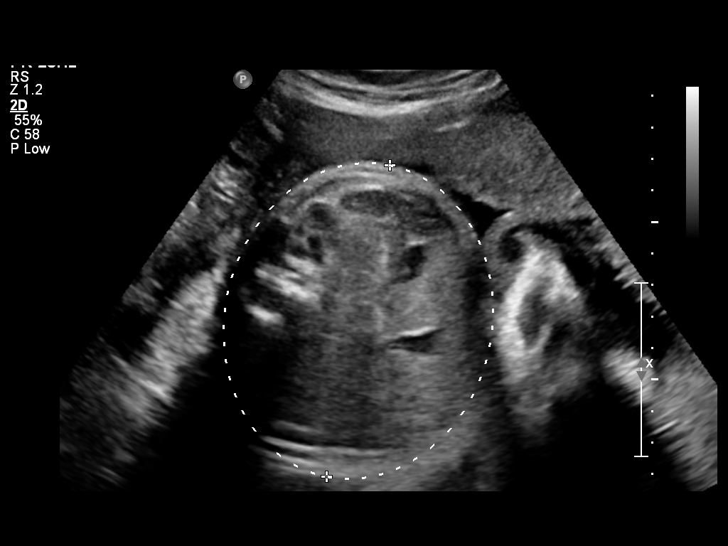
[im 37/66]
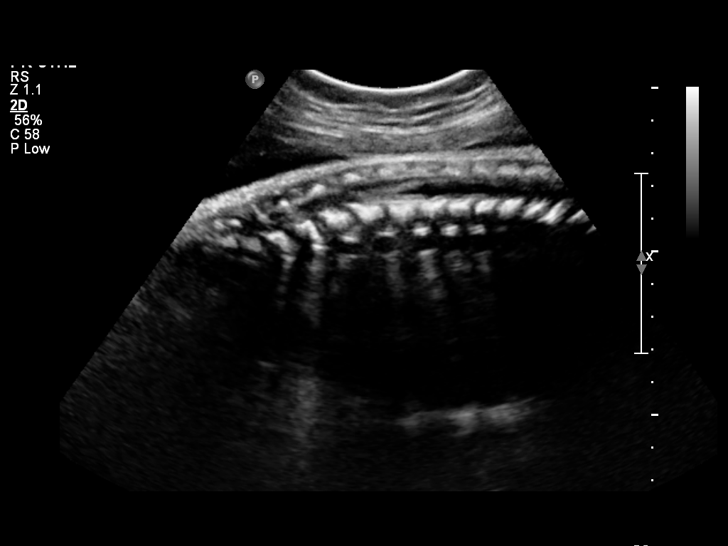
[im 41/66]
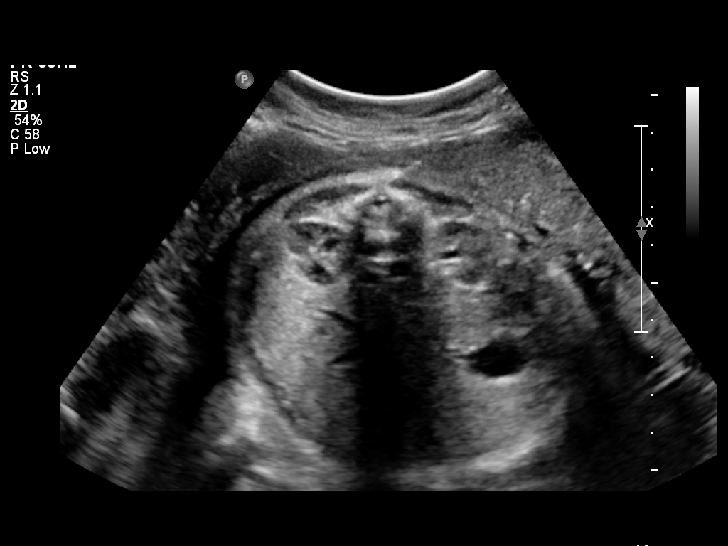
[im 46/66]
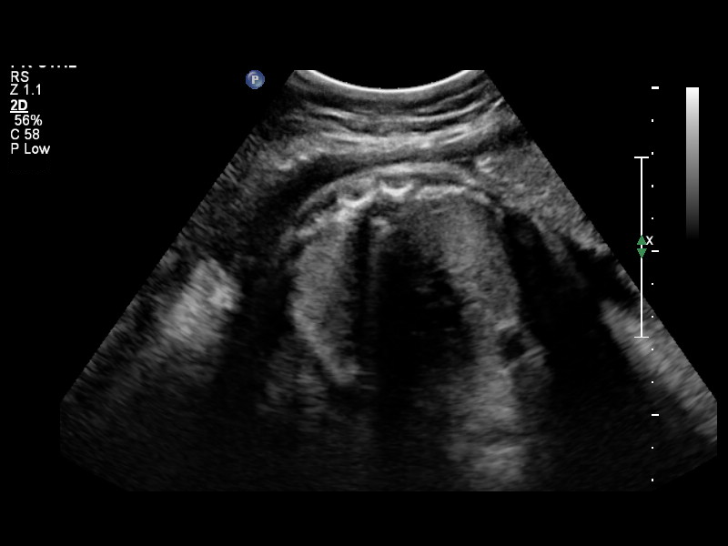
[im 53/66]
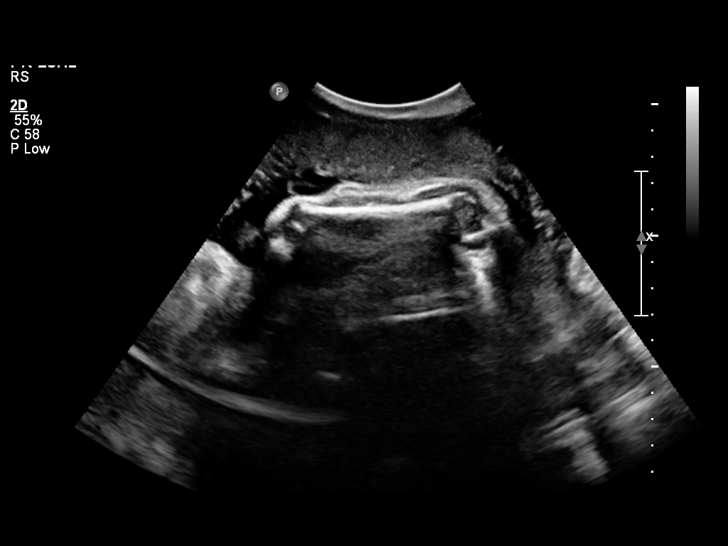
[im 58/66]
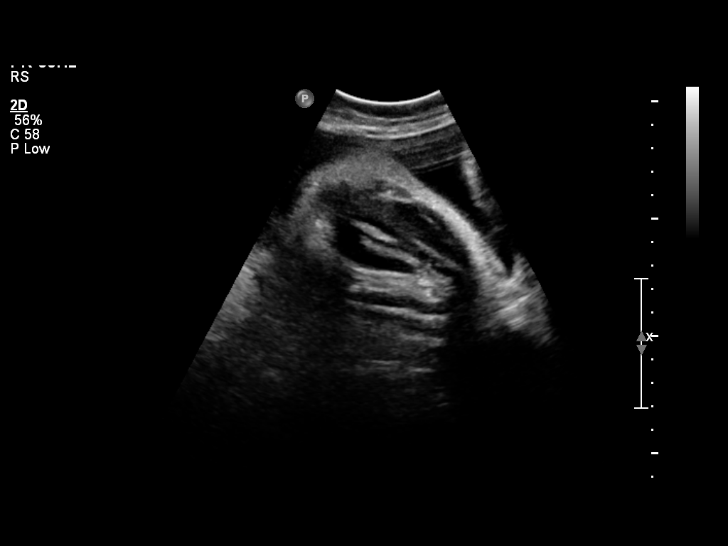
[im 63/66]
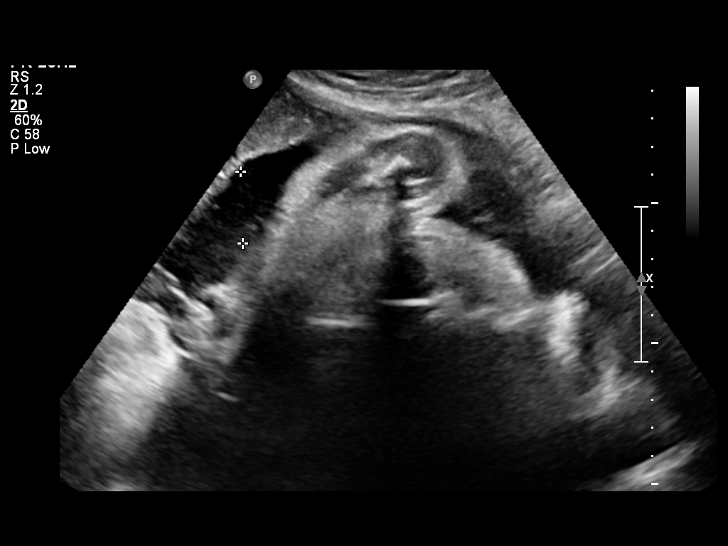

[12 of 28 positions shown; findings below may reference images not displayed]

OBSTETRICS REPORT
                      (Signed Final 11/06/2011 [DATE])

 Order#:         [id number]
Procedures

 US OB COMP + 14 WK                                    76805.1
Indications

 Late Prenatal Care
 Advanced maternal age (AMA), Multigravida
Fetal Evaluation

 Fetal Heart Rate:  139                         bpm
 Cardiac Activity:  Observed
 Presentation:      Cephalic
 Placenta:          Anterior, above cervical os
 P. Cord            Visualized
 Insertion:

 Amniotic Fluid
 AFI FV:      Subjectively within normal limits
 AFI Sum:     16.67   cm      61   %Tile     Larg Pckt:   5.87   cm
 RUQ:   4.43   cm    RLQ:    5.87   cm    LUQ:   2.54    cm   LLQ:    3.83   cm
Biometry

 BPD:     85.2  mm    G. Age:   34w 2d                CI:        71.71   70 - 86
                                                      FL/HC:      20.9   20.1 -

 HC:     320.3  mm    G. Age:   36w 1d       51  %    HC/AC:      1.07   0.93 -

 AC:       299  mm    G. Age:   33w 6d       32  %    FL/BPD:     78.4   71 - 87
 FL:      66.8  mm    G. Age:   34w 3d       33  %    FL/AC:      22.3   20 - 24

 Est. FW:    8990  gm      5 lb 4 oz     52  %
Gestational Age

 LMP:           34w 5d       Date:   03/08/11                 EDD:   12/13/11
 U/S Today:     34w 5d                                        EDD:   12/13/11
 Best:          34w 5d    Det. By:   LMP  (03/08/11)          EDD:   12/13/11
Anatomy

 Cranium:           Appears normal      Aortic Arch:       Basic anatomy
                                                           exam per order
 Fetal Cavum:       Appears normal      Ductal Arch:       Basic anatomy
                                                           exam per order
 Ventricles:        Not well            Diaphragm:         Appears normal
                    visualized
 Choroid Plexus:    Appears normal      Stomach:           Appears
                                                           normal, left
                                                           sided
 Cerebellum:        Not well            Abdomen:           Appears normal
                    visualized
 Posterior Fossa:   Not well            Abdominal Wall:    Appears nml
                    visualized                             (cord insert,
                                                           abd wall)
 Nuchal Fold:       Not applicable      Cord Vessels:      Appears normal
                    (>20 wks GA)                           (3 vessel cord)
 Face:              Lips appear         Kidneys:           Appear normal
                    normal
 Heart:             Appears normal      Bladder:           Appears normal
                    (4 chamber &
                    axis)
 RVOT:              Not well            Spine:             Appears normal
                    visualized
 LVOT:              Not well            Limbs:             Four extremities
                    visualized                             seen

 Other:     Technically difficult due to advanced GA and fetal
            position.
Cervix Uterus Adnexa

 Cervical Length:   3.5       cm

 Cervix:       Closed. Normal appearance by transabdominal scan.

 Adnexa:     No abnormality visualized.
Impression

   Single living intrauterine gestation with concordant
 gestational age and normal visualized anatomy.

 questions or concerns.

## 2014-07-26 ENCOUNTER — Encounter: Payer: Self-pay | Admitting: Advanced Practice Midwife

## 2020-01-16 ENCOUNTER — Ambulatory Visit: Payer: Self-pay | Attending: Internal Medicine

## 2020-01-16 DIAGNOSIS — Z23 Encounter for immunization: Secondary | ICD-10-CM

## 2020-01-16 NOTE — Progress Notes (Signed)
   Covid-19 Vaccination Clinic  Name:  Laiyah Exline    MRN: [id number] DOB: 1975-11-30  01/16/2020  Ms. Pollick was observed post Covid-19 immunization for 15 minutes without incident. She was provided with Vaccine Information Sheet and instruction to access the V-Safe system.   Ms. Witkop was instructed to call 911 with any severe reactions post vaccine: Marland Kitchen Difficulty breathing  . Swelling of face and throat  . A fast heartbeat  . A bad rash all over body  . Dizziness and weakness   Immunizations Administered    Name Date Dose VIS Date Route   Pfizer COVID-19 Vaccine 01/16/2020  3:15 PM 0.3 mL 11/18/2018 Intramuscular   Manufacturer: [employer], [employer]   Lot: [id number]   NDC: [address]-8

## 2020-02-13 ENCOUNTER — Ambulatory Visit: Payer: Self-pay | Attending: Internal Medicine

## 2020-02-13 DIAGNOSIS — Z23 Encounter for immunization: Secondary | ICD-10-CM

## 2020-02-13 NOTE — Progress Notes (Signed)
   Covid-19 Vaccination Clinic  Name:  Kassandra Meriweather    MRN: [id number] DOB: 01-17-1976  02/13/2020  Ms. Detore was observed post Covid-19 immunization for 15 minutes without incident. She was provided with Vaccine Information Sheet and instruction to access the V-Safe system.   Ms. Baldwin was instructed to call 911 with any severe reactions post vaccine: Marland Kitchen Difficulty breathing  . Swelling of face and throat  . A fast heartbeat  . A bad rash all over body  . Dizziness and weakness   Immunizations Administered    Name Date Dose VIS Date Route   Pfizer COVID-19 Vaccine 02/13/2020  2:25 PM 0.3 mL 11/18/2018 Intramuscular   Manufacturer: [employer], [employer]   Lot: [id number]   NDC: [address]-9

## 2020-06-24 ENCOUNTER — Other Ambulatory Visit: Payer: Self-pay | Admitting: Family Medicine

## 2020-06-24 MED FILL — VIT D2 1.25 MG (50,000 UNIT: 1.25 MG | 84 days supply | Qty: 12 | Fill #0

## 2020-06-28 ENCOUNTER — Other Ambulatory Visit: Payer: Self-pay

## 2020-06-28 ENCOUNTER — Inpatient Hospital Stay (HOSPITAL_COMMUNITY)
Admission: AD | Admit: 2020-06-28 | Discharge: 2020-06-28 | Disposition: A | Discharge status: Home / Self Care | Payer: 59 | Attending: Obstetrics and Gynecology | Admitting: Obstetrics and Gynecology

## 2020-06-28 ENCOUNTER — Inpatient Hospital Stay (HOSPITAL_COMMUNITY): Payer: 59

## 2020-06-28 ENCOUNTER — Encounter (HOSPITAL_COMMUNITY): Payer: Self-pay | Admitting: Obstetrics and Gynecology

## 2020-06-28 DIAGNOSIS — O200 Threatened abortion: Secondary | ICD-10-CM | POA: Diagnosis not present

## 2020-06-28 DIAGNOSIS — Z791 Long term (current) use of non-steroidal anti-inflammatories (NSAID): Secondary | ICD-10-CM | POA: Insufficient documentation

## 2020-06-28 DIAGNOSIS — Z3A09 9 weeks gestation of pregnancy: Secondary | ICD-10-CM | POA: Insufficient documentation

## 2020-06-28 DIAGNOSIS — Z79899 Other long term (current) drug therapy: Secondary | ICD-10-CM | POA: Insufficient documentation

## 2020-06-28 DIAGNOSIS — O209 Hemorrhage in early pregnancy, unspecified: Secondary | ICD-10-CM

## 2020-06-28 LAB — POCT PREGNANCY, URINE: Preg Test, Ur: POSITIVE — AB

## 2020-06-28 LAB — URINALYSIS, ROUTINE W REFLEX MICROSCOPIC
Bilirubin Urine: NEGATIVE
Glucose, UA: NEGATIVE mg/dL
Ketones, ur: NEGATIVE mg/dL
Leukocytes,Ua: NEGATIVE
Nitrite: NEGATIVE
Protein, ur: NEGATIVE mg/dL
Specific Gravity, Urine: 1.017 (ref 1.005–1.030)
pH: 5 (ref 5.0–8.0)

## 2020-06-28 LAB — CBC
HCT: 37.4 % (ref 36.0–46.0)
Hemoglobin: 11.9 g/dL — ABNORMAL LOW (ref 12.0–15.0)
MCH: 27.2 pg (ref 26.0–34.0)
MCHC: 31.8 g/dL (ref 30.0–36.0)
MCV: 85.4 fL (ref 80.0–100.0)
Platelets: 157 10*3/uL (ref 150–400)
RBC: 4.38 MIL/uL (ref 3.87–5.11)
RDW: 13.5 % (ref 11.5–15.5)
WBC: 6.4 10*3/uL (ref 4.0–10.5)
nRBC: 0 % (ref 0.0–0.2)

## 2020-06-28 LAB — WET PREP, GENITAL
Clue Cells Wet Prep HPF POC: NONE SEEN
Sperm: NONE SEEN
Trich, Wet Prep: NONE SEEN
Yeast Wet Prep HPF POC: NONE SEEN

## 2020-06-28 LAB — HIV ANTIBODY (ROUTINE TESTING W REFLEX): HIV Screen 4th Generation wRfx: NONREACTIVE

## 2020-06-28 LAB — HCG, QUANTITATIVE, PREGNANCY: hCG, Beta Chain, Quant, S: 15191 m[IU]/mL — ABNORMAL HIGH (ref <5)

## 2020-06-28 NOTE — MAU Note (Signed)
Presents with c/o heavy VB that began 7 days ago, states passing clots. [facility]  Reports LMP 04/25/2020.

## 2020-06-28 NOTE — MAU Provider Note (Signed)
History     CSN: [id number]  Arrival date and time: 06/28/20 1128   First Provider Initiated Contact with Patient 06/28/20 1341 - Interpreting by husband Ahmed (patient signed waiver declining hospital interpreting services      Chief Complaint  Patient presents with  . Vaginal Bleeding   Ms. Heather Hawkins is a 44 y.o. year old [id number] female at 9.[redacted] weeks gestation by LMP who presents to MAU reporting heavy VB and passing clots for 6-7 days. She states the bleeding looks like a normal menstrual cycle, but "has slowed down today." She reports a (+) HPT and a (+) UPT with an oiffce "NP in the [place] area." She reports her LMP was 04/25/2020. Her OB history is significant for SVD in 2006 (with FP in [place]), C/S for malpresentation in 2008 (in [place]), RCS in 2013 (in [place]) and [place] in 2018 (in [place]).    OB History    Gravida  5   Para  4   Term  4   Preterm  0   AB  0   Living  4     SAB  0   TAB  0   Ectopic  0   Multiple  0   Live Births  4           Past Medical History:  Diagnosis Date  . No pertinent past medical history    thrombocytopenia    Past Surgical History:  Procedure Laterality Date  . CESAREAN SECTION  June 2008   Breech presentation  . CESAREAN SECTION  12/25/2011   Procedure: CESAREAN SECTION;  Surgeon: Tereso Newcomer, MD;  Location: [facility];  Service: Gynecology;  Laterality: N/A;  Repeat Cesarean Section Delivery Girl @ 0413, Apgars 7/8    Family History  Problem Relation Age of Onset  . Diabetes Brother     Social History   Tobacco Use  . Smoking status: Never Smoker  . Smokeless tobacco: Never Used  Substance Use Topics  . Alcohol use: No  . Drug use: No    Allergies: No Known Allergies  Medications Prior to Admission  Medication Sig Dispense Refill Last Dose  . Prenatal Vit-Fe Fumarate-FA (PRENATAL MULTIVITAMIN) TABS Take 1 tablet by mouth daily.   06/27/2020 at Unknown time  . docusate sodium (COLACE)  100 MG capsule Take 100 mg by mouth 2 (two) times daily. For constipation     . ibuprofen (ADVIL,MOTRIN) 800 MG tablet Take 1 tablet (800 mg total) by mouth every 6 (six) hours as needed. 30 tablet 1   . norethindrone (ORTHO MICRONOR) 0.35 MG tablet Take 1 tablet (0.35 mg total) by mouth daily. 12 Package 0   . oxyCODONE-acetaminophen (PERCOCET) 10-325 MG per tablet Take 1 tablet by mouth every 4 (four) hours as needed. 40 tablet 0   . PROCTOFOAM HC rectal foam Place 1 application rectally Twice daily as needed.     Marland Kitchen witch hazel-glycerin (TUCKS) pad Place 1 application rectally as needed. For hemorrhoids.       Review of Systems  Constitutional: Negative.   HENT: Negative.   Eyes: Negative.   Respiratory: Negative.   Cardiovascular: Negative.   Gastrointestinal: Negative.   Endocrine: Negative.   Genitourinary: Positive for pelvic pain and vaginal bleeding (with blood clots).  Musculoskeletal: Negative.   Skin: Negative.   Allergic/Immunologic: Negative.   Neurological: Negative.   Hematological: Negative.   Psychiatric/Behavioral: Negative.    Physical Exam   Blood pressure 105/66, pulse 67, temperature 97.7  F (36.5 C), temperature source Oral, resp. rate 20, height 5\' 3"  (1.6 m), weight 77.6 kg, last menstrual period 04/25/2020, SpO2 100 %, currently breastfeeding.  Physical Exam Vitals and nursing note reviewed. Exam conducted with a chaperone present.  Constitutional:      Appearance: Normal appearance. She is obese.  HENT:     Head: Normocephalic and atraumatic.     Nose: Nose normal.     Mouth/Throat:     Mouth: Mucous membranes are moist.  Eyes:     Pupils: Pupils are equal, round, and reactive to light.  Cardiovascular:     Rate and Rhythm: Normal rate.     Pulses: Normal pulses.  Pulmonary:     Effort: Pulmonary effort is normal.  Abdominal:     Palpations: Abdomen is soft.  Genitourinary:    Comments: Uterus: enlarged and mildly tender, SE: cervix is  smooth, pink, no lesions, small amt of mucousy blood cleared from vaginal vault with 3 large cotton swabs -- WP, GC/CT done, closed/long/firm, no CMT or friability, no adnexal tenderness  Musculoskeletal:     Cervical back: Normal range of motion.  Skin:    General: Skin is warm and dry.  Neurological:     Mental Status: She is alert and oriented to person, place, and time.  Psychiatric:        Mood and Affect: Mood normal.        Behavior: Behavior normal.        Thought Content: Thought content normal.        Judgment: Judgment normal.     MAU Course  Procedures  MDM [facility]UA UPT CBC ABO/Rh -- not drawn due to known O POS HCG Wet Prep GC/CT -- pending HIV -- pending OB < 14 wks 06/25/2020 with TV  Results for orders placed or performed during the hospital encounter of 06/28/20 (from the past 24 hour(s))  Urinalysis, Routine w reflex microscopic Urine, Clean Catch     Status: Abnormal   Collection Time: 06/28/20 12:44 PM  Result Value Ref Range   Color, Urine YELLOW YELLOW   APPearance HAZY (A) CLEAR   Specific Gravity, Urine 1.017 1.005 - 1.030   pH 5.0 5.0 - 8.0   Glucose, UA NEGATIVE NEGATIVE mg/dL   Hgb urine dipstick MODERATE (A) NEGATIVE   Bilirubin Urine NEGATIVE NEGATIVE   Ketones, ur NEGATIVE NEGATIVE mg/dL   Protein, ur NEGATIVE NEGATIVE mg/dL   Nitrite NEGATIVE NEGATIVE   Leukocytes,Ua NEGATIVE NEGATIVE   RBC / HPF 0-5 0 - 5 RBC/hpf   WBC, UA 0-5 0 - 5 WBC/hpf   Bacteria, UA RARE (A) NONE SEEN   Squamous Epithelial / LPF 6-10 0 - 5   Mucus PRESENT   Pregnancy, urine POC     Status: Abnormal   Collection Time: 06/28/20  1:44 PM  Result Value Ref Range   Preg Test, Ur POSITIVE (A) NEGATIVE  Wet prep, genital     Status: Abnormal   Collection Time: 06/28/20  1:46 PM  Result Value Ref Range   Yeast Wet Prep HPF POC NONE SEEN NONE SEEN   Trich, Wet Prep NONE SEEN NONE SEEN   Clue Cells Wet Prep HPF POC NONE SEEN NONE SEEN   WBC, Wet Prep HPF POC FEW (A) NONE SEEN    Sperm NONE SEEN   CBC     Status: Abnormal   Collection Time: 06/28/20  2:34 PM  Result Value Ref Range   WBC 6.4 4.0 - 10.5  K/uL   RBC 4.38 3.87 - 5.11 MIL/uL   Hemoglobin 11.9 (L) 12.0 - 15.0 g/dL   HCT 70.6 36 - 46 %   MCV 85.4 80.0 - 100.0 fL   MCH 27.2 26.0 - 34.0 pg   MCHC 31.8 30.0 - 36.0 g/dL   RDW 23.7 62.8 - 31.5 %   Platelets 157 150 - 400 K/uL   nRBC 0.0 0.0 - 0.2 %  hCG, quantitative, pregnancy     Status: Abnormal   Collection Time: 06/28/20  2:34 PM  Result Value Ref Range   hCG, Beta Chain, Quant, S 15,191 (H) <5 mIU/mL  HIV Antibody (routine testing w rflx)     Status: None   Collection Time: 06/28/20  2:34 PM  Result Value Ref Range   HIV Screen 4th Generation wRfx Non Reactive Non Reactive    US OB LESS THAN 14 WEEKS WITH OB TRANSVAGINAL  Result Date: 06/28/2020 CLINICAL DATA:  Pregnant patient in early pregnancy with vaginal bleeding. Beta HCG 15,191. Gestational age based on 04/25/2020 LMP of 01/30/2021. EXAM: OBSTETRIC <14 WK [place] AND TRANSVAGINAL OB US TECHNIQUE: Both transabdominal and transvaginal ultrasound examinations were performed for complete evaluation of the gestation as well as the maternal uterus, adnexal regions, and pelvic cul-de-sac. Transvaginal technique was performed to assess early pregnancy. COMPARISON:  None. FINDINGS: Intrauterine gestational sac: Single, irregular in shape located in the lower uterine segment. Yolk sac:  Not Visualized. Embryo:  Not Visualized. Cardiac Activity: Not Visualized. MSD: 15 mm   6 w   2 d Subchorionic hemorrhage:  None visualized. Maternal uterus/adnexae: The left ovary measures 2.9 x 1.9 x 2.3 cm and contains rounded vascular structure that may represent a collapsing corpus luteum. The right ovary is not visualized. There is no adnexal mass or pelvic free fluid. IMPRESSION: 1. Single intrauterine gestational sac measuring 15 mm that is irregular in shape and located in the lower uterine segment. No yolk sac or  fetal pole. Findings are highly suspicious for failed pregnancy, and discordant for gestational age based on LMP. Recommend continued trending of beta HCG, sonographic follow-up as indicated. 2. No findings suspicious for ectopic pregnancy. Electronically Signed   By: Narda Rutherford M.D.   On: 06/28/2020 16:09     *Consult with Dr. Donavan Foil @ 1645 - notified of patient's complaints, assessments, lab & U/S results, recommended tx plan d/c home with threatened miscarriage, F/U with office for weekly HCG levels then see provider once HCG levels reach zero, ok to d/c home   Assessment and Plan  Threatened miscarriage in early pregnancy - Plan: Discharge patient - Information provided on threatened miscarriage - Explained to patient and husband the need to go to [facility] for F/U HCG in 1 week and weekly thereafter. Husband states the best day for him to get her to office for appts is on Mondays. - F/U HCG scheduled for Monday 07/04/2020 at 1:30 PM.   Raelyn Mora, MSN, CNM 06/28/2020, 1:41 PM

## 2020-06-28 NOTE — Discharge Instructions (Signed)
Return to MAU:  If you have heavier bleeding that soaks through more that 2 pads per hour for an hour or more  If you bleed so much that you feel like you might pass out or you do pass out  If you have significant abdominal pain that is not improved with Tylenol 1000 mg every 6 hours as needed for pain  If you develop a fever > 100.5   

## 2020-06-29 LAB — GC/CHLAMYDIA PROBE AMP (~~LOC~~) NOT AT ARMC
Chlamydia: NEGATIVE
Comment: NEGATIVE
Comment: NORMAL
Neisseria Gonorrhea: NEGATIVE

## 2020-07-04 ENCOUNTER — Encounter: Payer: Self-pay | Admitting: *Deleted

## 2020-07-04 ENCOUNTER — Other Ambulatory Visit: Payer: Self-pay

## 2020-07-04 ENCOUNTER — Ambulatory Visit (INDEPENDENT_AMBULATORY_CARE_PROVIDER_SITE_OTHER): Payer: 59 | Admitting: *Deleted

## 2020-07-04 VITALS — BP 100/40 | HR 66 | Temp 98.0°F

## 2020-07-04 DIAGNOSIS — O200 Threatened abortion: Secondary | ICD-10-CM | POA: Diagnosis not present

## 2020-07-04 NOTE — Progress Notes (Signed)
Pt here for repeat BHCG. Denies any pain or vag bleeding. BHCG was 15,191 on 06/28/20. [place] that day showed irreg gest sac only. Understands will draw labs today and make her aware of results and plan of care based on lab results. Pt voices understanding. Spouses number was given as [phone] per pt's permission

## 2020-07-05 LAB — BETA HCG QUANT (REF LAB): hCG Quant: 5890 m[IU]/mL

## 2020-07-07 ENCOUNTER — Telehealth: Payer: Self-pay | Admitting: *Deleted

## 2020-07-07 DIAGNOSIS — O209 Hemorrhage in early pregnancy, unspecified: Secondary | ICD-10-CM

## 2020-07-07 NOTE — Telephone Encounter (Addendum)
Call received from pt's husband who stated that his wife was advised to call the office in order to be informed of plan of care based on lab results from 10/11. I stated that I will discuss with a provider and call back later today. I will need to be able to speak with Vernadine at the time of my return call. Pt's husband stated understanding.   10/15  1320 Called and spoke w/pt's husband who was in Callahan's presence. I advised that she will need follow up US next week. I will not be able to get that scheduled today. I will call back on Monday 10/18 with further information. Pt's husband voiced understanding and provided information to Nera. He that she is not having any abdominal pain. She is having small amount of dark vaginal bleeding with tissue sometimes. Pt is aware that she should go to MAU if she develops heavy vaginal bleeding or severe abdominal/pelvic pain.   10/18   1630  I called and spoke with pt's husband and informed him of [place] appt on 10/28 @ 1400. He stated that pt will not be able to keep that appt time due to child care issues. He requested an appt on a Monday or any Ahni Bradwell about 1100. I advised that I have sent a message to Dr. Debroah Loop to see if there is an alternate plan of care which is possible as provider had wanted the ultrasound to be performed this week. I will call back on Wednesday 10/20 with an update. He informed Ladan of this information and they both voiced understanding.    10/20  1100  I discussed pt's plan of care with Dr. Debroah Loop and he reviewed EMR. He recommended that pt have non-stat BHCG this week and then provider appointment early next week in order to offer options for next steps. Per chart review, pt changed her ultrasound appointment to 11/1 @ 1:00 pm. Appointments were scheduled in office as recommended by Dr. Debroah Loop. I called to discuss with pt and her husband acted as Equities trader. He and pt agreed to lab only appointment on 10/22. Pt's husband did not want to bring his wife  for appointment with provider on 10/25 because he stated that she would then have appointment for ultrasound and after that could possible need another provider appointment. I stated that the purpose of the the appointment on 10/25 was to discuss lab result and whether or not the ultrasound would need to be performed or possibly a different plan of care. He stated that they would prefer to receive lab result and plan of care recommendations via phone in order to decrease number of visits. I confirmed understanding and stated that I will inform Dr. Debroah Loop.

## 2020-07-11 ENCOUNTER — Other Ambulatory Visit: Payer: 59

## 2020-07-12 NOTE — Telephone Encounter (Signed)
Per chart review, ultrasound appt has been scheduled for 10/28. Called patient and her husband answered stating they were aware of the appointment but were not available that day. He asked about an appointment for Monday. Provided Radiology scheduling phone number and advised they call to reschedule. He verbalized understanding.

## 2020-07-14 NOTE — Telephone Encounter (Signed)
10/20  I had discussion of pt's plan of care with Dr. Debroah Loop who reviewed EMR.

## 2020-07-15 ENCOUNTER — Other Ambulatory Visit: Payer: 59

## 2020-07-15 ENCOUNTER — Other Ambulatory Visit: Payer: Self-pay | Admitting: General Practice

## 2020-07-15 ENCOUNTER — Other Ambulatory Visit: Payer: Self-pay

## 2020-07-15 DIAGNOSIS — O200 Threatened abortion: Secondary | ICD-10-CM

## 2020-07-16 LAB — BETA HCG QUANT (REF LAB): hCG Quant: 1205 m[IU]/mL

## 2020-07-18 ENCOUNTER — Ambulatory Visit: Payer: 59 | Admitting: Obstetrics and Gynecology

## 2020-07-20 ENCOUNTER — Telehealth: Payer: Self-pay | Admitting: Lactation Services

## 2020-07-20 NOTE — Telephone Encounter (Signed)
Called patient to inform her of results of HCG. Reviewed case with Diane Day, RN who has been working with the family.   Patient and husband on phone when I returned call. Patient identified herself and her birth date.   Reviewed that they can come tomorrow for lab work to check Hcg and follow up with ultrasound on Monday then follow with seeing MD. Reviewed that since levels are dropping, the ultrasound is most likely not needed and can be followed by serial Hcg.   Patient's husband reports since it may take a long time to get the results that he would prefer that they come in Monday for the [place] at 1 pm and follow up with the Provider at 1:30  pm. Patient and husband voiced understanding.

## 2020-07-20 NOTE — Telephone Encounter (Signed)
Female called in for patient and is wanting results. Requesting a call back.

## 2020-07-21 ENCOUNTER — Other Ambulatory Visit: Payer: 59

## 2020-07-25 ENCOUNTER — Other Ambulatory Visit: Payer: Self-pay

## 2020-07-25 ENCOUNTER — Ambulatory Visit
Admission: RE | Admit: 2020-07-25 | Discharge: 2020-07-25 | Disposition: A | Discharge status: Home / Self Care | Payer: 59 | Source: Ambulatory Visit | Attending: Obstetrics and Gynecology | Admitting: Obstetrics and Gynecology

## 2020-07-25 ENCOUNTER — Encounter: Payer: Self-pay | Admitting: Obstetrics and Gynecology

## 2020-07-25 ENCOUNTER — Ambulatory Visit (INDEPENDENT_AMBULATORY_CARE_PROVIDER_SITE_OTHER): Payer: 59 | Admitting: Obstetrics and Gynecology

## 2020-07-25 DIAGNOSIS — O209 Hemorrhage in early pregnancy, unspecified: Secondary | ICD-10-CM | POA: Diagnosis not present

## 2020-07-25 DIAGNOSIS — O039 Complete or unspecified spontaneous abortion without complication: Secondary | ICD-10-CM | POA: Insufficient documentation

## 2020-07-25 NOTE — Patient Instructions (Signed)
Miscarriage A miscarriage is the loss of an unborn baby (fetus) before the 20th week of pregnancy. Follow these instructions at home: Medicines   Take over-the-counter and prescription medicines only as told by your doctor.  If you were prescribed antibiotic medicine, take it as told by your doctor. Do not stop taking the antibiotic even if you start to feel better.  Do not take NSAIDs unless your doctor says that this is safe for you. NSAIDs include aspirin and ibuprofen. These medicines can cause bleeding. Activity  Rest as directed. Ask your doctor what activities are safe for you.  Have someone help you at home during this time. General instructions  Write down how many pads you use each day and how soaked they are.  Watch the amount of tissue or clumps of blood (blood clots) that you pass from your vagina. Save any large amounts of tissue for your doctor.  Do not use tampons, douche, or have sex until your doctor approves.  To help you and your partner with the process of grieving, talk with your doctor or seek counseling.  When you are ready, meet with your doctor to talk about steps you should take for your health. Also, talk with your doctor about steps to take to have a healthy pregnancy in the future.  Keep all follow-up visits as told by your doctor. This is important. Contact a doctor if:  You have a fever or chills.  You have vaginal discharge that smells bad.  You have more bleeding. Get help right away if:  You have very bad cramps or pain in your back or belly.  You pass clumps of blood that are walnut-sized or larger from your vagina.  You pass tissue that is walnut-sized or larger from your vagina.  You soak more than 1 regular pad in an hour.  You get light-headed or weak.  You faint (pass out).  You have feelings of sadness that do not go away, or you have thoughts of hurting yourself. Summary  A miscarriage is the loss of an unborn baby before  the 20th week of pregnancy.  Follow your doctor's instructions for home care. Keep all follow-up appointments.  To help you and your partner with the process of grieving, talk with your doctor or seek counseling. This information is not intended to replace advice given to you by your health care provider. Make sure you discuss any questions you have with your health care provider. Document Revised: 01/02/2019 Document Reviewed: 10/16/2016 Elsevier Patient Education  2020 Elsevier Inc.  

## 2020-07-25 NOTE — Progress Notes (Signed)
Husband to interpret for the patient.  Release form under media  Addison Naegeli, RN  07/25/20

## 2020-07-25 NOTE — Progress Notes (Signed)
Heather Hawkins presents with her husband for f/u of U/S for probable failed pregnancy. See prior MAU and U/S note from 06/28/20. BHCG has continue to decrease, last level @ 1200. U/S today no evidence of prior gestationall sac, ? hemorrhage into prior c section scar. Discussed U/S with Dr Tyron Russell Pt has no complaints today. Denies any vaginal bleeding, cramps or pain.  PE AF VSS Lungs clear Heart RRR Abd soft + BS  A/P SAB Reviewed with pt and husband. Will check BHCG and blood type today. Will follow level down to < 5 or zero. If plateaus will most likely need repeat U/S. Discussed contraction interested in IUD as this pregnancy occurred while taking OCP's. Instructed to use condoms until IUP can be placed.

## 2020-07-26 LAB — BETA HCG QUANT (REF LAB): hCG Quant: 387 m[IU]/mL

## 2020-07-26 LAB — ABO AND RH: Rh Factor: POSITIVE

## 2020-07-27 ENCOUNTER — Telehealth: Payer: Self-pay | Admitting: *Deleted

## 2020-07-27 DIAGNOSIS — O039 Complete or unspecified spontaneous abortion without complication: Secondary | ICD-10-CM

## 2020-07-27 NOTE — Telephone Encounter (Signed)
-----   Message from Hermina Staggers, MD sent at 07/27/2020 10:27 AM EDT ----- Please have pt repeat BHCG in 2 weeks, routine. Thanks Casimiro Needle

## 2020-07-27 NOTE — Telephone Encounter (Signed)
Called patient with [facility] [phone] and spoke with Female .He said she is not there; I asked him to ask her to call us back or we can call back and asked what time we should call. He said they would call .  Baelynn Schmuhl,RN

## 2020-07-28 ENCOUNTER — Telehealth: Payer: Self-pay | Admitting: Lactation Services

## 2020-07-28 NOTE — Telephone Encounter (Signed)
Called patient with [place] interpreter [phone], patient and husband answered the phone stating they do not need an interpreter and have told us that multiple times. Apologized and stated I would make the appropriate change in the chart. Informed patient of results & her current scheduled 11/17 appt for repeat bhcg. Patient verbalized understanding & states that time doesn't work for her and she would like to come in at 11am. Told her that was fine. Patient had no other questions.

## 2020-07-28 NOTE — Telephone Encounter (Signed)
Patients husband called and asked for patients lab results.   Called patient back and husband answered. Patient came on line. Both report they received there results and appointment yesterday and have no questions or concerns at this time.

## 2020-08-10 ENCOUNTER — Other Ambulatory Visit: Payer: 59

## 2020-08-10 DIAGNOSIS — O039 Complete or unspecified spontaneous abortion without complication: Secondary | ICD-10-CM

## 2020-08-11 ENCOUNTER — Telehealth: Payer: Self-pay | Admitting: Lactation Services

## 2020-08-11 LAB — BETA HCG QUANT (REF LAB): hCG Quant: 167 m[IU]/mL

## 2020-08-11 NOTE — Telephone Encounter (Signed)
Yes, BHCG has to < 5 before IUD can be placed Casimiro Needle

## 2020-08-11 NOTE — Telephone Encounter (Signed)
Called patient and spoke with patients husband who serves as her interpreter. Patient and husband was informed that her level has decreased to 167 and a follow up Hcg needed in 2 weeks.   Husband was concerned patient is not able to get her IUD yet and would like to confirm with the MD if they really have to wait until the level is 0. Reviewed that is the current recommendation and will reach out to MD for advisement.  Message to front desk to reach out to patient to schedule follow up Hcg in 2 weeks.

## 2020-08-11 NOTE — Telephone Encounter (Signed)
Called patient and her husband to inform them that Dr. Alysia Penna confirmed that her Hcg levels must be less than 5 to place the IUD. Patient voiced understanding.

## 2020-08-11 NOTE — Telephone Encounter (Signed)
-----   Message from Hermina Staggers, MD sent at 08/11/2020  9:32 AM EST ----- Please let pt know that her BHCG continues to decrease. Repeat BHCG, non stat, in two weeks  Thanks Casimiro Needle

## 2020-08-23 ENCOUNTER — Other Ambulatory Visit: Payer: Self-pay | Admitting: *Deleted

## 2020-08-23 DIAGNOSIS — O039 Complete or unspecified spontaneous abortion without complication: Secondary | ICD-10-CM

## 2020-08-25 ENCOUNTER — Other Ambulatory Visit: Payer: Self-pay

## 2020-08-25 ENCOUNTER — Other Ambulatory Visit: Payer: 59

## 2020-08-25 DIAGNOSIS — O039 Complete or unspecified spontaneous abortion without complication: Secondary | ICD-10-CM

## 2020-08-26 LAB — BETA HCG QUANT (REF LAB): hCG Quant: 101 m[IU]/mL

## 2020-08-29 ENCOUNTER — Telehealth: Payer: Self-pay | Admitting: *Deleted

## 2020-08-29 DIAGNOSIS — O039 Complete or unspecified spontaneous abortion without complication: Secondary | ICD-10-CM

## 2020-08-29 NOTE — Telephone Encounter (Signed)
Message left by pt's husband stating that they are waiting to hear about her lab result.

## 2020-08-30 NOTE — Telephone Encounter (Signed)
Reviewed result with Dr. Crissie Reese and we discussed next bhcg in about 2 weeks in hope it has dropped to normal. I called Heather Hawkins and her husband who interprets for her and notified him her result is 101 and she needs another lab draw as it continues to drop slowly ; but still not normal. I informed them we recommend in 2 weeks from last draw- they prefer 09/05/20 at 11:00. He also reported for her that she did have bleeding after her last draw and also mucousy / brown discharge. I explained hopefully her lab will have dropped.  I asked if she has questions or concerns to call us back.  Heather Shevlin,RN

## 2020-09-02 ENCOUNTER — Telehealth: Payer: Self-pay | Admitting: *Deleted

## 2020-09-02 NOTE — Telephone Encounter (Addendum)
-----   Message from Hermina Staggers, MD sent at 09/02/2020  8:43 AM EST ----- Please lat pt know that her BHCG  continues to decrease but is not < 5 let. Repeat BJCG in 1 month, not stat.  Thanks Casimiro Needle   12/10 1000  Called pt and discussed results and recommended plan of care. Pt's husband stated that he had spoken with another nurse earlier in the week and because Nisha has been having mucousy/brown discharge, a plan was made for her to have BHCG on 12/13. Per chart review, this plan of care was confirmed and I advised pt and her husband to keep appt as scheduled on 12/13.

## 2020-09-05 ENCOUNTER — Other Ambulatory Visit: Payer: Self-pay

## 2020-09-05 ENCOUNTER — Other Ambulatory Visit: Payer: Self-pay | Admitting: Family Medicine

## 2020-09-05 ENCOUNTER — Other Ambulatory Visit: Payer: 59

## 2020-09-05 DIAGNOSIS — O039 Complete or unspecified spontaneous abortion without complication: Secondary | ICD-10-CM

## 2020-09-05 MED FILL — CEPHALEXIN 500 MG CAPSULE: 500 | 5 days supply | Qty: 15 | Fill #0

## 2020-09-06 LAB — BETA HCG QUANT (REF LAB): hCG Quant: 70 m[IU]/mL

## 2020-09-07 ENCOUNTER — Telehealth: Payer: Self-pay | Admitting: Lactation Services

## 2020-09-07 NOTE — Telephone Encounter (Signed)
Called patient and spoke with husband, patient was present. Informed that lab level was at 70 and needs repeat in 1-2 weeks. Lab appt scheduled for 12/22 at 11 am for non stat Beta. Message to front office to place on schedule.

## 2020-09-13 ENCOUNTER — Other Ambulatory Visit: Payer: Self-pay | Admitting: *Deleted

## 2020-09-13 DIAGNOSIS — O039 Complete or unspecified spontaneous abortion without complication: Secondary | ICD-10-CM

## 2020-09-14 ENCOUNTER — Other Ambulatory Visit: Payer: 59

## 2020-09-14 ENCOUNTER — Other Ambulatory Visit: Payer: Self-pay

## 2020-09-14 DIAGNOSIS — O039 Complete or unspecified spontaneous abortion without complication: Secondary | ICD-10-CM

## 2020-09-15 ENCOUNTER — Telehealth: Payer: Self-pay | Admitting: Family Medicine

## 2020-09-15 DIAGNOSIS — O039 Complete or unspecified spontaneous abortion without complication: Secondary | ICD-10-CM

## 2020-09-15 LAB — BETA HCG QUANT (REF LAB): hCG Quant: 55 m[IU]/mL

## 2020-09-15 NOTE — Telephone Encounter (Signed)
Please call patient and tell her that the very slow rate of hcg decline continues and we will need to continue to trend it, please have her return in 2 weeks for a repeat beta.

## 2020-09-15 NOTE — Telephone Encounter (Signed)
Called patient and her husband answered stating I could give him the results. Asked to speak to the patient several times before patient's husband gave her the phone. Informed patient of results and recommendation of follow up quant in 2 weeks. Patient's husband says they are not coming back in 2 weeks they are coming back in 1 month. He states we have been checking these levels for months now and they keep getting bills. He says they will come back in 1 month and not sooner. Told him we are just making sure everything is returning to normal and that unfortunately it is just taking a little while. He said they will not come in any sooner. Offered appt for 1/17 @ 1030 and he states they will come in then. Phone line was then disconnected.

## 2020-10-10 ENCOUNTER — Other Ambulatory Visit: Payer: 59

## 2020-10-11 ENCOUNTER — Other Ambulatory Visit: Payer: 59

## 2020-10-11 ENCOUNTER — Other Ambulatory Visit: Payer: Self-pay

## 2020-10-11 DIAGNOSIS — O039 Complete or unspecified spontaneous abortion without complication: Secondary | ICD-10-CM

## 2020-10-12 LAB — BETA HCG QUANT (REF LAB): hCG Quant: 35 m[IU]/mL

## 2020-10-14 ENCOUNTER — Telehealth: Payer: Self-pay

## 2020-10-14 NOTE — Telephone Encounter (Signed)
Spoke with pt. Pt given results and recommendations per Dr Alysia Penna. Pt agreeable and verbalized understanding. Pt to be scheduled for repeat beta Hcg 11/12/2020. Pt agreeable to date.  Pt aware will get phone call for lab only visit schedule or mychart message.  Future Beta Hcg orders placed.  Encounter closed  Staff message sent to front office staff.

## 2020-10-14 NOTE — Telephone Encounter (Signed)
-----   Message from Hermina Staggers, MD sent at 10/12/2020 11:19 AM EST ----- Please let pt know that her pregnancy hormonal level continues to decrease. But we need to still follow until < 5. Recommend repeat BHCG in 1 month  Thanks Casimiro Needle

## 2020-11-10 ENCOUNTER — Other Ambulatory Visit: Payer: Self-pay | Admitting: *Deleted

## 2020-11-10 DIAGNOSIS — O039 Complete or unspecified spontaneous abortion without complication: Secondary | ICD-10-CM

## 2020-11-11 ENCOUNTER — Other Ambulatory Visit: Payer: 59

## 2020-12-01 ENCOUNTER — Telehealth: Payer: Self-pay | Admitting: Family Medicine

## 2020-12-01 NOTE — Telephone Encounter (Signed)
Pt spouse called and stated that they forgot/missed appt 11-11-20 for lab work and needs to know if this is still needed and if so can they come in tomorrow.

## 2020-12-07 NOTE — Telephone Encounter (Signed)
Called and spoke with husband. An appointment has been rescheduled to 3/28. He reports no questions or concerns at this time.

## 2020-12-19 ENCOUNTER — Encounter: Payer: Self-pay | Admitting: Family Medicine

## 2020-12-19 ENCOUNTER — Other Ambulatory Visit (HOSPITAL_COMMUNITY)
Admission: RE | Admit: 2020-12-19 | Discharge: 2020-12-19 | Disposition: A | Discharge status: Home / Self Care | Payer: 59 | Source: Ambulatory Visit | Attending: Family Medicine | Admitting: Family Medicine

## 2020-12-19 ENCOUNTER — Ambulatory Visit (INDEPENDENT_AMBULATORY_CARE_PROVIDER_SITE_OTHER): Payer: 59 | Admitting: Family Medicine

## 2020-12-19 ENCOUNTER — Other Ambulatory Visit: Payer: Self-pay

## 2020-12-19 VITALS — BP 110/75 | HR 76 | Ht 63.0 in | Wt 169.8 lb

## 2020-12-19 DIAGNOSIS — Z975 Presence of (intrauterine) contraceptive device: Secondary | ICD-10-CM | POA: Diagnosis not present

## 2020-12-19 DIAGNOSIS — Z0000 Encounter for general adult medical examination without abnormal findings: Secondary | ICD-10-CM

## 2020-12-19 DIAGNOSIS — Z30430 Encounter for insertion of intrauterine contraceptive device: Secondary | ICD-10-CM | POA: Diagnosis not present

## 2020-12-19 DIAGNOSIS — O039 Complete or unspecified spontaneous abortion without complication: Secondary | ICD-10-CM | POA: Diagnosis not present

## 2020-12-19 DIAGNOSIS — Z3202 Encounter for pregnancy test, result negative: Secondary | ICD-10-CM

## 2020-12-19 LAB — POCT PREGNANCY, URINE: Preg Test, Ur: NEGATIVE

## 2020-12-19 MED ORDER — LEVONORGESTREL 19.5 MCG/DAY IU IUD: INTRAUTERINE_SYSTEM | Freq: Once | INTRAUTERINE | Status: AC

## 2020-12-19 NOTE — Progress Notes (Signed)
Pt states no pain nor bleeding. Pt wants the [facility] IUD  For [facility].

## 2020-12-19 NOTE — Progress Notes (Signed)
li  GYNECOLOGY OFFICE VISIT NOTE  History:   Heather Hawkins is a 45 y.o. [id number] here today for follow up for contraceptive management following SAB 06/2020. She denies any abnormal vaginal discharge, bleeding, pelvic pain or other concerns. Here today to discuss contraception. Does not desire pregnancy. No history of VTE. Non-smoker. No history of migraines with auras. Has been on contraception in the past, nexplanon had intermenstrual spotting.   Past Medical History:  Diagnosis Date  . No pertinent past medical history    thrombocytopenia    Past Surgical History:  Procedure Laterality Date  . CESAREAN SECTION  June 2008   Breech presentation  . CESAREAN SECTION  12/25/2011   Procedure: CESAREAN SECTION;  Surgeon: Tereso Newcomer, MD;  Location: [facility];  Service: Gynecology;  Laterality: N/A;  Repeat Cesarean Section Delivery Girl @ 0413, Apgars 7/8    The following portions of the patient's history were reviewed and updated as appropriate: allergies, current medications, past family history, past medical history, past social history, past surgical history and problem list.   Health Maintenance:  No pap smear on file, no history of abnormal pap smears.  Review of Systems:  Pertinent items noted in HPI and remainder of comprehensive ROS otherwise negative.  Physical Exam:  BP 110/75   Pulse 76   Ht 5\' 3"  (1.6 m)   Wt 169 lb 12.8 oz (77 kg)   LMP 04/25/2020 Comment: SAB  Breastfeeding Unknown   BMI 30.08 kg/m  CONSTITUTIONAL: Well-developed, well-nourished female in no acute distress.  HEENT:  Normocephalic, atraumatic. External right and left ear normal. No scleral icterus.  NECK: Normal range of motion, supple, no masses noted on observation SKIN: No rash noted. Not diaphoretic. No erythema. No pallor. MUSCULOSKELETAL: Normal range of motion. No edema noted. NEUROLOGIC: Alert and oriented to person, place, and time. Normal muscle tone coordination. No cranial nerve deficit  noted. PSYCHIATRIC: Normal mood and affect. Normal behavior. Normal judgment and thought content. CARDIOVASCULAR: Normal heart rate noted RESPIRATORY: Effort and breath sounds normal, no problems with respiration noted ABDOMEN: No masses noted. No other overt distention noted.   PELVIC: Normal appearing external genitalia; normal urethral meatus; normal appearing vaginal mucosa and cervix.  No abnormal discharge noted.  Normal uterine size, no other palpable masses, no uterine or adnexal tenderness. Performed in the presence of a chaperone  IUD Insertion Patient identified, informed consent performed, signed copy in chart, time out was performed.  Urine pregnancy test negative.  Speculum placed in the vagina.  Cervix visualized.  Cleaned with Betadine x 2.  Grasped anteriourly with a single tooth tenaculum.  Uterus sounded to 8cm.  Liletta IUD placed per manufacturer's recommendations.  Strings trimmed to 3 cm. Pt tolerated procedure well.  Patient given post procedure instructions and IUD care card with expiration date  Labs and Imaging Results for orders placed or performed in visit on 12/19/20 (from the past 168 hour(s))  Pregnancy, urine POC   Collection Time: 12/19/20 11:28 AM  Result Value Ref Range   Preg Test, Ur NEGATIVE NEGATIVE   No results found.    Assessment and Plan:        Encounter for IUD insertion Counseled on risks/benefits of liletta IUD for contraception. Patient amendable. Consents signed. Patient tolerated procedure well. RTC in 6 wks for string check. -     levonorgestrel (LILETTA) 19.5 MCG/DAY IUD -     POCT urine pregnancy  SAB (spontaneous abortion) 06/2020, no further bleeding/pain. Negative UPT today.  Healthcare maintenance No pap smear on file, cytology+cotesting today. -     Cytology - PAP  Other orders -     Pregnancy, urine POC    Routine preventative health maintenance measures emphasized. Please refer to After Visit Summary for other  counseling recommendations.   Return in about 6 weeks (around 01/30/2021) for IUD string check.    Total face-to-face time with patient: 30 minutes.  Over 50% of encounter was spent on counseling and coordination of care.   Alric Seton, MD OB Fellow, [facility], Center for [facility] 12/19/2020 12:45 PM

## 2020-12-21 LAB — CYTOLOGY - PAP
Comment: NEGATIVE
Diagnosis: NEGATIVE
High risk HPV: NEGATIVE

## 2021-01-30 ENCOUNTER — Other Ambulatory Visit: Payer: Self-pay

## 2021-01-30 ENCOUNTER — Ambulatory Visit (INDEPENDENT_AMBULATORY_CARE_PROVIDER_SITE_OTHER): Payer: 59 | Admitting: Nurse Practitioner

## 2021-01-30 VITALS — BP 121/77 | HR 73 | Wt 169.8 lb

## 2021-01-30 DIAGNOSIS — T8332XA Displacement of intrauterine contraceptive device, initial encounter: Secondary | ICD-10-CM

## 2021-01-30 NOTE — Progress Notes (Signed)
    GYNECOLOGY OFFICE ENCOUNTER NOTE  History:  45 y.o. [id number] here today for today for IUD string check; Liletta  IUD was placed  12-19-20. No complaints about the IUD, no concerning side effects.  Had 9 day menses prior to IUD insertion.  Had one menses lasting 6 days and then now - 3 weeks later - is on menses again.  Declined interpreter today and signed declination form.  Husband frustrated by needing to sign again, but able to proceed with today's visit.  The following portions of the patient's history were reviewed and updated as appropriate: allergies, current medications, past family history, past medical history, past social history, past surgical history and problem list. Last pap smear on 12-19-20 was normal, negative HRHPV.  Review of Systems:  Pertinent items are noted in HPI.   Objective:  Physical Exam Blood pressure 121/77, pulse 73, weight 169 lb 12.8 oz (77 kg), last menstrual period 04/25/2020, unknown if currently breastfeeding. CONSTITUTIONAL: Well-developed, well-nourished female in no acute distress.  HENT:  Normocephalic, atraumatic. External right and left ear normal. Oropharynx is clear and moist EYES: Conjunctivae and EOM are normal. Pupils are equal, round, and reactive to light. No scleral icterus.  NECK: Normal range of motion, supple, no masses CARDIOVASCULAR: Normal heart rate noted RESPIRATORY: Effort and breath sounds normal, no problems with respiration noted ABDOMEN: Soft, no distention noted.   PELVIC: Normal appearing external genitalia; normal appearing vaginal mucosa and cervix.  Dark blood noted - 2 large swabs cleared the vagina for better visualization.  IUD strings not visualized,   Assessment & Plan:  IUD strings not seen Will get Ultrasound and make plan based on results. Client and her husband agreeable with this plan. All questions were answered.  Nolene Bernheim, RN, MSN, NP-BC Nurse Practitioner, [facility] for [employer], [facility] 01/30/2021 1:02 PM

## 2021-02-02 ENCOUNTER — Other Ambulatory Visit: Payer: Self-pay

## 2021-02-02 ENCOUNTER — Ambulatory Visit (HOSPITAL_COMMUNITY)
Admission: RE | Admit: 2021-02-02 | Discharge: 2021-02-02 | Disposition: A | Discharge status: Home / Self Care | Payer: 59 | Source: Ambulatory Visit | Attending: Nurse Practitioner | Admitting: Nurse Practitioner

## 2021-02-02 DIAGNOSIS — T8332XA Displacement of intrauterine contraceptive device, initial encounter: Secondary | ICD-10-CM | POA: Insufficient documentation

## 2021-02-03 ENCOUNTER — Telehealth: Payer: Self-pay

## 2021-02-03 NOTE — Telephone Encounter (Addendum)
-----   Message from Currie Paris, NP sent at 02/02/2021  8:16 PM EDT ----- Call client.  IUD strings not seen on exam and on [place] IUD is not in the uterus.  Does she want an appointment for replacing IUD or another type of birth control. She does speak English if you speak very slowly.  Also OK to talk with her husband.  They have signed a release for no interpreter needed with them.  He often accompanies her to visits.  Notified pt provider's recommendation.  I advised pt to schedule another appt with Terri as she was unsure of what to do at this time.  Pt agreed and message sent to front office to schedule an appt.  Leonette Nutting  02/03/21

## 2021-02-07 ENCOUNTER — Ambulatory Visit: Payer: 59 | Admitting: Nurse Practitioner

## 2021-02-13 ENCOUNTER — Ambulatory Visit (INDEPENDENT_AMBULATORY_CARE_PROVIDER_SITE_OTHER): Payer: 59 | Admitting: Nurse Practitioner

## 2021-02-13 ENCOUNTER — Other Ambulatory Visit: Payer: Self-pay

## 2021-02-13 ENCOUNTER — Encounter: Payer: Self-pay | Admitting: Nurse Practitioner

## 2021-02-13 VITALS — BP 120/93 | HR 82 | Ht 61.0 in | Wt 176.0 lb

## 2021-02-13 DIAGNOSIS — Z3202 Encounter for pregnancy test, result negative: Secondary | ICD-10-CM

## 2021-02-13 DIAGNOSIS — Z30017 Encounter for initial prescription of implantable subdermal contraceptive: Secondary | ICD-10-CM | POA: Diagnosis not present

## 2021-02-13 DIAGNOSIS — Z975 Presence of (intrauterine) contraceptive device: Secondary | ICD-10-CM | POA: Insufficient documentation

## 2021-02-13 LAB — POCT PREGNANCY, URINE: Preg Test, Ur: NEGATIVE

## 2021-02-13 MED ORDER — ETONOGESTREL 68 MG ~~LOC~~ IMPL
68.0000 mg | DRUG_IMPLANT | Freq: Once | SUBCUTANEOUS | Status: AC
  Administered 2021-02-13: 68 mg via SUBCUTANEOUS

## 2021-02-13 NOTE — Progress Notes (Signed)
     GYNECOLOGY OFFICE PROCEDURE NOTE  Dashauna Sperry is a 45 y.o. G2B6389 here for Nexplanon insertion.  Last pap smear was on 12-19-2020 and was normal.  No other gynecologic concerns.  Nexplanon Removal Patient identified, informed consent performed, consent signed.   Appropriate time out taken. Nexplanon site identified.  Area prepped in usual sterile fashon. Three ml of 1% lidocaine was used to anesthetize the area.  The Nexplanon rod was inserted by company guidelines.  There was minimal blood loss. There were no complications.    A pressure bandage was applied to reduce any bruising.  The patient tolerated the procedure well and was given post procedure instructions.  Patient is planning to use condoms for contraception for 2 weeks. Call the office if you have bleeding that is lasting longer than one week.  Had Nexplanon in [place] and did have longer bleeding then.  Is worried about bleeding.  Advised there is medication we can prescribe if needed for the irregular bleeding. Return in 1 year for annual exam.   Nolene Bernheim, RN, MSN, NP-BC Nurse Practitioner, [facility] for [employer], [facility] 02/13/2021 3:53 PM

## 2021-02-13 NOTE — Patient Instructions (Signed)
Contraceptive Implant Information A contraceptive implant is a small, plastic rod that is inserted under the skin of the upper arm. The implant releases a hormone into the bloodstream that prevents pregnancy. They do not protect against STIs (sexually transmitted infections). How does the implant work? Contraceptive implants prevent pregnancy by releasing a small amount of progestin into the bloodstream. Progestin has similar effects to the hormone progesterone, which plays a role in menstrual periods and pregnancy. Progestin will:  Stop the ovaries from releasing eggs.  Thicken cervical mucus to prevent sperm from entering the cervix.  Thin out the lining of the uterus to prevent a fertilized egg from attaching to the wall of the uterus. What are the advantages of this form of birth control? The advantages of this form of birth control include the following:  It is very effective at preventing pregnancy.  It is effective for up to 3 years.  It does not interfere with sex or daily activities. It cannot be seen by others, but it can be felt.  It can be used when breastfeeding.  It can be used by women who cannot take estrogen.  The procedure to insert the device is quick. It can also be easily removed.  Women can get pregnant shortly after removing the device. What are the disadvantages of this form of birth control? The disadvantages of this form of birth control include the following:  It can cause side effects, including: ? Changes in your bleeding pattern, such as:  Bleeding for a longer or shorter time during your menstrual period.  No bleeding at all during the time of your menstrual period.  Spotting between your menstrual periods.  Changes in the amount of time between your menstrual periods. ? Headache. ? Weight gain. ? Acne. ? Breast tenderness. ? Abdominal or back pain. ? Mood changes, such as depression.  It does not protect against STIs.  You must make an  office visit to have it inserted and removed by a trained clinician.  Inserting or removing the device can result in pain, scarring, and tissue or nerve damage. This is rare. How is this implant inserted? The procedure to insert an implant only takes a few minutes. Before the procedure:  You should talk with your health care provider about when to schedule the procedure.  You may have to take a pregnancy test. This involves having a urine sample taken. During the procedure:  Your upper arm will be numbed with a numbing medicine (local anesthetic).  The implant will be injected under the skin of your upper arm with a needle. After the procedure:  You may experience minor bruising, swelling, or discomfort at the insertion site. This should only last for a couple of days.  You may need to use another, nonhormonal contraceptive such as a condom for 7 days after the procedure.   How is the implant removed? The implant should be removed after 3 years or as directed by your health care provider. The procedure to remove the implant only takes a few minutes. During this procedure:  Your upper arm will be numbed with a numbing medicine (local anesthetic).  A small incision will be made near the implant.  The implant will be removed with a small pair of forceps. After the implant is removed:  The effect of the implant will wear off in a few hours. Some women may be able to get pregnant as early as one week after the removal.  A new implant can be  inserted as soon as the old one is removed, if desired.  You may get minor bruising, swelling, or discomfort at the removal site. This should only last for a couple of days. Is this implant right for me? Your health care provider can help you determine whether you are a good candidate for a contraceptive implant. Make sure to discuss the possible side effects with your health care provider. You should not get the implant if you:  Are  pregnant.  Are allergic to any part of the implant.  Have a history of: ? Breast cancer. ? Abnormal bleeding from the vagina. ? Liver disease or tumors. If you have any of the following conditions, you should talk with your health care provider to see if a contraceptive implant is right for you:  Diabetes.  Blood clots  High cholesterol.  Headaches.  Gallbladder or kidney problems.  Depression.  High blood pressure.  Allergic reactions to medicines.   Follow these instructions at home:  If you cannot feel the implant, contact your health care provider.  Following the insertion, you will have to wear a pressure bandage for 24 hours and a small bandage for 3-5 days. Summary  A contraceptive implant is a small, plastic rod that is inserted under the skin of the upper arm. The implant releases a hormone into the bloodstream that prevents pregnancy.  Contraceptive implants can be effective for up to 3 years.  The implant works by preventing ovaries from releasing eggs, thickening the cervical mucus, and thinning the uterine wall.  This form of birth control is very effective at preventing pregnancy and can be inserted and removed quickly. Women can get pregnant shortly after the device is removed.  This form of birth control can cause some side effects, including weight gain, breast tenderness, headaches, irregular menstrual periods or bleeding, acne, abdominal or back pain, and depression. It does not protect against STIs (sexually transmitted infections). This information is not intended to replace advice given to you by your health care provider. Make sure you discuss any questions you have with your health care provider. Document Revised: 03/23/2020 Document Reviewed: 03/23/2020 Elsevier Patient Education  2021 [employer].

## 2021-06-23 ENCOUNTER — Other Ambulatory Visit: Payer: Self-pay

## 2021-06-23 ENCOUNTER — Encounter: Payer: Self-pay | Admitting: Family Medicine

## 2021-06-23 ENCOUNTER — Other Ambulatory Visit: Payer: Self-pay | Admitting: Family Medicine

## 2021-06-23 ENCOUNTER — Ambulatory Visit
Admission: RE | Admit: 2021-06-23 | Discharge: 2021-06-23 | Disposition: A | Discharge status: Home / Self Care | Payer: 59 | Source: Ambulatory Visit | Attending: Family Medicine | Admitting: Family Medicine

## 2021-06-23 ENCOUNTER — Ambulatory Visit (INDEPENDENT_AMBULATORY_CARE_PROVIDER_SITE_OTHER): Payer: 59 | Admitting: Family Medicine

## 2021-06-23 VITALS — BP 127/70 | HR 83 | Wt 172.2 lb

## 2021-06-23 DIAGNOSIS — Z30431 Encounter for routine checking of intrauterine contraceptive device: Secondary | ICD-10-CM

## 2021-06-23 DIAGNOSIS — Z975 Presence of (intrauterine) contraceptive device: Secondary | ICD-10-CM

## 2021-06-23 MED ORDER — NORETHIN ACE-ETH ESTRAD-FE 1-20 MG-MCG(24) PO TABS
1.0000 | ORAL_TABLET | Freq: Every day | ORAL | 1 refills | Status: DC
Start: 2021-06-23 — End: 2021-08-14

## 2021-06-23 NOTE — Progress Notes (Signed)
   Subjective:    Patient ID: Heather Hawkins is a 45 y.o. female presenting with Menorrhagia  on 06/23/2021  HPI: Has Nexplanon in place and notes some bleeding 2x/month. Also with spotting continuously. Has some bleeding after intercourse. Reports previously having same issue in [place] and getting OCPs x 2 months to help and it worked.  Review of Systems  Constitutional:  Negative for chills and fever.  Respiratory:  Negative for shortness of breath.   Cardiovascular:  Negative for chest pain.  Gastrointestinal:  Negative for abdominal pain, nausea and vomiting.  Genitourinary:  Negative for dysuria.  Skin:  Negative for rash.     Objective:    BP 127/70   Pulse 83   Wt 172 lb 3.2 oz (78.1 kg)   BMI 32.54 kg/m  Physical Exam Constitutional:      General: She is not in acute distress.    Appearance: She is well-developed.  HENT:     Head: Normocephalic and atraumatic.  Eyes:     General: No scleral icterus. Cardiovascular:     Rate and Rhythm: Normal rate.  Pulmonary:     Effort: Pulmonary effort is normal.  Abdominal:     Palpations: Abdomen is soft.  Musculoskeletal:     Cervical back: Neck supple.  Skin:    General: Skin is warm and dry.  Neurological:     Mental Status: She is alert and oriented to person, place, and time.        Assessment & Plan:   IUD check up - IUD is not in the uterus--will check x-ray to ensure no abdominal placement - Plan: CANCELED: DG Abd 2 Views  Nexplanon in place - trial of low dose OCPs x 2 months to help with bleeding--can try another option if not working. - Plan: Norethindrone Acetate-Ethinyl Estrad-FE (LOESTRIN 24 FE) 1-20 MG-MCG(24) tablet  Return if symptoms worsen or fail to improve.  Reva Bores 06/23/2021 11:25 AM

## 2021-06-26 ENCOUNTER — Telehealth: Payer: Self-pay | Admitting: General Practice

## 2021-06-26 NOTE — Telephone Encounter (Signed)
Called patient and informed her of xray results. Discussed she will need a follow up appt with Dr Shawnie Pons to discuss results in more detail as well as removal which might include surgery. Told her someone would call her with an appt. Patient verbalized understanding.

## 2021-06-26 NOTE — Telephone Encounter (Signed)
-----   Message from Reva Bores, MD sent at 06/25/2021 12:09 PM EDT ----- Please bring her back in for discussion of her IUD which is in the abdomen and will need to be removed.

## 2021-07-12 ENCOUNTER — Other Ambulatory Visit: Payer: Self-pay

## 2021-07-12 ENCOUNTER — Encounter: Payer: Self-pay | Admitting: Family Medicine

## 2021-07-12 ENCOUNTER — Encounter: Payer: Self-pay | Admitting: *Deleted

## 2021-07-12 ENCOUNTER — Ambulatory Visit (INDEPENDENT_AMBULATORY_CARE_PROVIDER_SITE_OTHER): Payer: 59 | Admitting: Family Medicine

## 2021-07-12 ENCOUNTER — Telehealth: Payer: Self-pay | Admitting: *Deleted

## 2021-07-12 DIAGNOSIS — T8332XD Displacement of intrauterine contraceptive device, subsequent encounter: Secondary | ICD-10-CM | POA: Diagnosis not present

## 2021-07-12 DIAGNOSIS — T8332XA Displacement of intrauterine contraceptive device, initial encounter: Secondary | ICD-10-CM | POA: Insufficient documentation

## 2021-07-12 NOTE — Progress Notes (Signed)
   Subjective:    Patient ID: Heather Hawkins is a 45 y.o. female presenting with Results  on 07/12/2021  HPI: Here for f/u for lost IUD. Had negative u/s. X-ray revealed IUD in abdomen. Has Nexplanon in place. Had been given Loestrin to help with cycle control--she is not taking it due to the IUD being present.  Review of Systems  Constitutional:  Negative for chills and fever.  Respiratory:  Negative for shortness of breath.   Cardiovascular:  Negative for chest pain.  Gastrointestinal:  Negative for abdominal pain, nausea and vomiting.  Genitourinary:  Negative for dysuria.  Skin:  Negative for rash.     Objective:    BP 120/84   Pulse 78   Wt 170 lb 3.1 oz (77.2 kg)   BMI 32.16 kg/m  Physical Exam Constitutional:      General: She is not in acute distress.    Appearance: She is well-developed.  HENT:     Head: Normocephalic and atraumatic.  Eyes:     General: No scleral icterus. Cardiovascular:     Rate and Rhythm: Normal rate.  Pulmonary:     Effort: Pulmonary effort is normal.  Abdominal:     Palpations: Abdomen is soft.  Musculoskeletal:     Cervical back: Neck supple.  Skin:    General: Skin is warm and dry.  Neurological:     Mental Status: She is alert and oriented to person, place, and time.        Assessment & Plan:   Problem List Items Addressed This Visit       Unprioritized   Malpositioned IUD    In abdomen. Needs laparoscopic removal. Risks include but are not limited to bleeding, infection, injury to surrounding structures, including bowel, bladder and ureters, blood clots, and death.  Likelihood of success is high.        Total time in review of prior notes, radiology with review of images, labs, history taking, review with patient, exam, note writing, discussion of options, review risks of surgery, plan for next steps, alternatives and risks of treatment: 30 minutes.  Return in about 6 weeks (around 08/23/2021) for postop check.  Reva Bores 07/12/2021 2:26 PM

## 2021-07-12 NOTE — Assessment & Plan Note (Signed)
In abdomen. Needs laparoscopic removal. Risks include but are not limited to bleeding, infection, injury to surrounding structures, including bowel, bladder and ureters, blood clots, and death.  Likelihood of success is high.

## 2021-07-12 NOTE — Telephone Encounter (Signed)
Call to patient- husband answers and then places call to add patient on three-way call. Discussed surgery date options and agreeable for 08-01-21. Advised location of [facility] at 11 am and arrive at 9 am. Brief review of post-op care, no driving for 24 hours, and pre-op instructions which will be mailed. Also advised will receive pre-op call from hospital.   Encounter closed.

## 2021-07-27 NOTE — H&P (Signed)
  Heather Hawkins is an 45 y.o. [id number] female.   Chief Complaint: misplaced IUD HPI: Patient with IUD placed and could not find strings. Nexplanon placed and X-ray revealed IUD in abdomen. For removal.  Past Medical History:  Diagnosis Date   No pertinent past medical history    thrombocytopenia    Past Surgical History:  Procedure Laterality Date   CESAREAN SECTION  June 2008   Breech presentation   CESAREAN SECTION  12/25/2011   Procedure: CESAREAN SECTION;  Surgeon: Tereso Newcomer, MD;  Location: [facility];  Service: Gynecology;  Laterality: N/A;  Repeat Cesarean Section Delivery Girl @ 0413, Apgars 7/8    Family History  Problem Relation Age of Onset   Diabetes Brother    Social History:  reports that she has never smoked. She has never used smokeless tobacco. She reports that she does not drink alcohol and does not use drugs.  Allergies: No Known Allergies  No medications prior to admission.    A comprehensive review of systems was negative.  There were no vitals taken for this visit. General appearance: alert, cooperative, and appears stated age Head: Normocephalic, without obvious abnormality, atraumatic Neck: supple, symmetrical, trachea midline Lungs:  normal effort Heart: regular rate and rhythm Abdomen: soft, non-tender; bowel sounds normal; no masses,  no organomegaly Extremities: extremities normal, atraumatic, no cyanosis or edema Skin: Skin color, texture, turgor normal. No rashes or lesions Neurologic: Grossly normal   Lab Results  Component Value Date   WBC 6.4 06/28/2020   HGB 11.9 (L) 06/28/2020   HCT 37.4 06/28/2020   MCV 85.4 06/28/2020   PLT 157 06/28/2020   Lab Results  Component Value Date   PREGTESTUR NEGATIVE 02/13/2021   HCGQUANT 35 10/11/2020     Assessment/Plan Principal Problem:   Malpositioned IUD  For laparoscopic removal of IUD Risks include but are not limited to bleeding, infection, injury to surrounding structures,  including bowel, bladder and ureters, blood clots, and death.  Likelihood of success is high.    Reva Bores 07/27/2021, 11:32 AM

## 2021-07-31 ENCOUNTER — Other Ambulatory Visit: Payer: Self-pay

## 2021-07-31 ENCOUNTER — Encounter (HOSPITAL_COMMUNITY): Payer: Self-pay | Admitting: Family Medicine

## 2021-07-31 NOTE — Progress Notes (Signed)
Spoke with pt's husband Loistine Chance, after getting pt's permission to do so. He states pt does not have a cardiac history, no HTN or Diabetes.  Pt's surgery is scheduled as ambulatory so no Covid test is required prior to surgery.

## 2021-08-01 ENCOUNTER — Encounter (HOSPITAL_COMMUNITY)
Admission: RE | Disposition: A | Discharge status: Home / Self Care | Payer: Self-pay | Source: Ambulatory Visit | Attending: Family Medicine

## 2021-08-01 ENCOUNTER — Ambulatory Visit (HOSPITAL_COMMUNITY): Payer: 59 | Admitting: Certified Registered Nurse Anesthetist

## 2021-08-01 ENCOUNTER — Encounter (HOSPITAL_COMMUNITY): Payer: Self-pay | Admitting: Family Medicine

## 2021-08-01 ENCOUNTER — Other Ambulatory Visit: Payer: Self-pay

## 2021-08-01 ENCOUNTER — Ambulatory Visit (HOSPITAL_COMMUNITY)
Admission: RE | Admit: 2021-08-01 | Discharge: 2021-08-01 | Disposition: A | Discharge status: Home / Self Care | Payer: 59 | Source: Ambulatory Visit | Attending: Family Medicine | Admitting: Family Medicine

## 2021-08-01 DIAGNOSIS — T8332XA Displacement of intrauterine contraceptive device, initial encounter: Secondary | ICD-10-CM | POA: Diagnosis present

## 2021-08-01 DIAGNOSIS — T8332XD Displacement of intrauterine contraceptive device, subsequent encounter: Secondary | ICD-10-CM

## 2021-08-01 DIAGNOSIS — K660 Peritoneal adhesions (postprocedural) (postinfection): Secondary | ICD-10-CM | POA: Insufficient documentation

## 2021-08-01 DIAGNOSIS — Z98891 History of uterine scar from previous surgery: Secondary | ICD-10-CM | POA: Diagnosis not present

## 2021-08-01 DIAGNOSIS — Y768 Miscellaneous obstetric and gynecological devices associated with adverse incidents, not elsewhere classified: Secondary | ICD-10-CM | POA: Insufficient documentation

## 2021-08-01 HISTORY — PX: LAPAROSCOPY: SHX197

## 2021-08-01 LAB — POCT PREGNANCY, URINE: Preg Test, Ur: NEGATIVE

## 2021-08-01 LAB — CBC
HCT: 43.4 % (ref 36.0–46.0)
Hemoglobin: 13.3 g/dL (ref 12.0–15.0)
MCH: 26.8 pg (ref 26.0–34.0)
MCHC: 30.6 g/dL (ref 30.0–36.0)
MCV: 87.3 fL (ref 80.0–100.0)
Platelets: 175 10*3/uL (ref 150–400)
RBC: 4.97 MIL/uL (ref 3.87–5.11)
RDW: 13.2 % (ref 11.5–15.5)
WBC: 7.1 10*3/uL (ref 4.0–10.5)
nRBC: 0 % (ref 0.0–0.2)

## 2021-08-01 SURGERY — LAPAROSCOPY, DIAGNOSTIC
Anesthesia: General | Site: Abdomen

## 2021-08-01 MED ORDER — FENTANYL CITRATE (PF) 250 MCG/5ML IJ SOLN
INTRAMUSCULAR | Status: DC | PRN
  Administered 2021-08-01: 50 ug via INTRAVENOUS
  Administered 2021-08-01: 100 ug via INTRAVENOUS
  Administered 2021-08-01 (×2): 50 ug via INTRAVENOUS

## 2021-08-01 MED ORDER — CHLORHEXIDINE GLUCONATE 0.12 % MT SOLN
OROMUCOSAL | Status: AC
  Administered 2021-08-01: 15 mL via OROMUCOSAL
  Filled 2021-08-01: qty 15

## 2021-08-01 MED ORDER — MIDAZOLAM HCL 5 MG/5ML IJ SOLN
INTRAMUSCULAR | Status: DC | PRN
  Administered 2021-08-01: 2 mg via INTRAVENOUS

## 2021-08-01 MED ORDER — BUPIVACAINE HCL (PF) 0.25 % IJ SOLN
INTRAMUSCULAR | Status: AC
  Filled 2021-08-01: qty 10

## 2021-08-01 MED ORDER — ROCURONIUM BROMIDE 10 MG/ML (PF) SYRINGE
PREFILLED_SYRINGE | INTRAVENOUS | Status: DC | PRN
  Administered 2021-08-01: 40 mg via INTRAVENOUS

## 2021-08-01 MED ORDER — POVIDONE-IODINE 10 % EX SWAB
2.0000 "application " | Freq: Once | CUTANEOUS | Status: AC
  Administered 2021-08-01: 2 via TOPICAL

## 2021-08-01 MED ORDER — ORAL CARE MOUTH RINSE: 15.0000 mL | Freq: Once | OROMUCOSAL | Status: AC

## 2021-08-01 MED ORDER — OXYCODONE HCL 5 MG PO TABS: 5.0000 mg | ORAL_TABLET | Freq: Four times a day (QID) | ORAL | 0 refills | Status: DC | PRN

## 2021-08-01 MED ORDER — FENTANYL CITRATE (PF) 100 MCG/2ML IJ SOLN
INTRAMUSCULAR | Status: AC
  Filled 2021-08-01: qty 2

## 2021-08-01 MED ORDER — ONDANSETRON HCL 4 MG/2ML IJ SOLN
INTRAMUSCULAR | Status: DC | PRN
  Administered 2021-08-01: 4 mg via INTRAVENOUS

## 2021-08-01 MED ORDER — LIDOCAINE 2% (20 MG/ML) 5 ML SYRINGE
INTRAMUSCULAR | Status: DC | PRN
  Administered 2021-08-01: 50 mg via INTRAVENOUS

## 2021-08-01 MED ORDER — BUPIVACAINE HCL (PF) 0.25 % IJ SOLN
INTRAMUSCULAR | Status: DC | PRN
  Administered 2021-08-01: 5 mL

## 2021-08-01 MED ORDER — ACETAMINOPHEN 500 MG PO TABS
ORAL_TABLET | ORAL | Status: AC
  Administered 2021-08-01: 1000 mg via ORAL
  Filled 2021-08-01: qty 2

## 2021-08-01 MED ORDER — ROCURONIUM BROMIDE 10 MG/ML (PF) SYRINGE
PREFILLED_SYRINGE | INTRAVENOUS | Status: AC
  Filled 2021-08-01: qty 10

## 2021-08-01 MED ORDER — FENTANYL CITRATE (PF) 250 MCG/5ML IJ SOLN
INTRAMUSCULAR | Status: AC
  Filled 2021-08-01: qty 5

## 2021-08-01 MED ORDER — PROPOFOL 10 MG/ML IV BOLUS
INTRAVENOUS | Status: DC | PRN
  Administered 2021-08-01: 200 mg via INTRAVENOUS

## 2021-08-01 MED ORDER — SODIUM CHLORIDE 0.9 % IR SOLN
Status: DC | PRN
  Administered 2021-08-01: 1000 mL

## 2021-08-01 MED ORDER — KETOROLAC TROMETHAMINE 30 MG/ML IJ SOLN
INTRAMUSCULAR | Status: AC
  Filled 2021-08-01: qty 1

## 2021-08-01 MED ORDER — DEXAMETHASONE SODIUM PHOSPHATE 10 MG/ML IJ SOLN
INTRAMUSCULAR | Status: DC | PRN
Start: 2021-08-01 — End: 2021-08-01
  Administered 2021-08-01: 10 mg via INTRAVENOUS

## 2021-08-01 MED ORDER — FENTANYL CITRATE (PF) 100 MCG/2ML IJ SOLN
25.0000 ug | INTRAMUSCULAR | Status: DC | PRN
  Administered 2021-08-01: 25 ug via INTRAVENOUS

## 2021-08-01 MED ORDER — SUGAMMADEX SODIUM 200 MG/2ML IV SOLN
INTRAVENOUS | Status: DC | PRN
  Administered 2021-08-01: 292 mg via INTRAVENOUS

## 2021-08-01 MED ORDER — AMISULPRIDE (ANTIEMETIC) 5 MG/2ML IV SOLN
INTRAVENOUS | Status: AC
  Filled 2021-08-01: qty 4

## 2021-08-01 MED ORDER — LACTATED RINGERS IV SOLN: INTRAVENOUS | Status: DC

## 2021-08-01 MED ORDER — ACETAMINOPHEN 500 MG PO TABS: 1000.0000 mg | ORAL_TABLET | ORAL | Status: AC

## 2021-08-01 MED ORDER — CHLORHEXIDINE GLUCONATE 0.12 % MT SOLN: 15.0000 mL | Freq: Once | OROMUCOSAL | Status: AC

## 2021-08-01 MED ORDER — AMISULPRIDE (ANTIEMETIC) 5 MG/2ML IV SOLN
10.0000 mg | Freq: Once | INTRAVENOUS | Status: AC | PRN
  Administered 2021-08-01: 10 mg via INTRAVENOUS

## 2021-08-01 MED ORDER — MIDAZOLAM HCL 2 MG/2ML IJ SOLN
INTRAMUSCULAR | Status: AC
  Filled 2021-08-01: qty 2

## 2021-08-01 SURGICAL SUPPLY — 30 items
ADH SKN CLS APL DERMABOND .7 (GAUZE/BANDAGES/DRESSINGS) ×1
BAG SPEC RTRVL LRG 6X4 10 (ENDOMECHANICALS)
BLADE SURG 15 STRL LF DISP TIS (BLADE) ×1 IMPLANT
BLADE SURG 15 STRL SS (BLADE) ×3
DERMABOND ADVANCED (GAUZE/BANDAGES/DRESSINGS) ×2
DERMABOND ADVANCED .7 DNX12 (GAUZE/BANDAGES/DRESSINGS) IMPLANT
DRSG OPSITE POSTOP 3X4 (GAUZE/BANDAGES/DRESSINGS) IMPLANT
DURAPREP 26ML APPLICATOR (WOUND CARE) ×3 IMPLANT
GLOVE SURG LTX SZ7 (GLOVE) ×3 IMPLANT
GLOVE SURG UNDER POLY LF SZ7 (GLOVE) ×12 IMPLANT
GOWN STRL REUS W/ TWL LRG LVL3 (GOWN DISPOSABLE) ×3 IMPLANT
GOWN STRL REUS W/TWL LRG LVL3 (GOWN DISPOSABLE) ×9
IRRIGATION STRYKERFLOW (MISCELLANEOUS) IMPLANT
IRRIGATOR STRYKERFLOW (MISCELLANEOUS)
KIT TURNOVER KIT B (KITS) ×3 IMPLANT
PACK LAPAROSCOPY BASIN (CUSTOM PROCEDURE TRAY) ×3 IMPLANT
PACK TRENDGUARD 450 HYBRID PRO (MISCELLANEOUS) IMPLANT
POUCH SPECIMEN RETRIEVAL 10MM (ENDOMECHANICALS) IMPLANT
PROTECTOR NERVE ULNAR (MISCELLANEOUS) ×6 IMPLANT
SET TUBE SMOKE EVAC HIGH FLOW (TUBING) ×3 IMPLANT
SHEARS HARMONIC ACE PLUS 36CM (ENDOMECHANICALS) IMPLANT
SLEEVE ENDOPATH XCEL 5M (ENDOMECHANICALS) ×3 IMPLANT
SUT VIC AB 3-0 X1 27 (SUTURE) ×3 IMPLANT
SUT VICRYL 0 UR6 27IN ABS (SUTURE) ×4 IMPLANT
TOWEL GREEN STERILE FF (TOWEL DISPOSABLE) ×6 IMPLANT
TRAY FOLEY W/BAG SLVR 14FR (SET/KITS/TRAYS/PACK) ×3 IMPLANT
TRENDGUARD 450 HYBRID PRO PACK (MISCELLANEOUS) ×3
TROCAR BALLN 12MMX100 BLUNT (TROCAR) ×3 IMPLANT
TROCAR XCEL NON-BLD 5MMX100MML (ENDOMECHANICALS) ×3 IMPLANT
WARMER LAPAROSCOPE (MISCELLANEOUS) ×1 IMPLANT

## 2021-08-01 NOTE — Op Note (Signed)
Preoperative diagnosis: Misplaced IUD  Postoperative diagnosis: Same  Procedure: Diagnostic laparoscopy, removal of IUD  Surgeon: Tinnie Gens, MD  Assistant: Milas Hock, MD - An experienced assistant was required given the standard of surgical care given the complexity of the case.  This assistant was needed for exposure, dissection, suctioning, retraction, instrument exchange, and for overall help during the procedure.  Anesthesia: Gardiner Rhyme, MD  Findings: Dense omental adhesions in the midline from umbilicus to bladder on anterior abdominal wall. Uterus, tubes and ovaries are not seen.  Estimated blood loss: Minimal  Complications: None known  Specimens: None  Reason for procedure: 45 y.o. [id number] with h/o prior c-section with IUD placed and no strings noted. U/S showed no IUD.  Nexplanon placed and xray revealed IUD in abdomen. Here for removal.  Procedure: Patient was taken to the operating room was placed in dorsal lithotomy in [place] stirrups. She was prepped and draped in the usual sterile fashion. A timeout was performed. The patient had SCDs in place. Foley catheter is used to drain bladder. Speculum was placed inside the vagina. The cervix was visualized and grasped anteriorly with a single-tooth tenaculum. A Hulka tenaculum was placed through the cervix for uterine manipulation. The single tooth tenaculum and speculum were removed from the vagina.  Attention was then turned to the abdomen. Four cc of 0.25% Marcaine was injected at the umbilicus. Two Allis clamps were used to tent up the skin of the umbilicus a vertical one half centimeter incision was made here. The fascia was incised with the knife  A Kelly clamp was used to open a natural defect in the fascia the peritoneum was entered bluntly with this. Two edges of the fascia were tagged with a 0 Vicryl suture on a UR 6 needle. A Hassan trocar was placed through this incision and a pneumoperitoneum was created.  The patient was then placed in Trendelenburg. There were dense adhesions in the midline of omentum from prior c-sections. IUD strings noted. The operative scope was used to pull the strings and IUD was removed intact. The insturments were removed. Fascia closed with previous 0 Vicryl purse string suture and no defect noted. Skin closed with 3-0 Vicryl and dermabond. All instrument, needle  and lap counts were correct x 2. The patient was awakened to recovery in stable condition.  Shelbie Proctor PrattMD 08/01/2021 1:12 PM

## 2021-08-01 NOTE — Anesthesia Postprocedure Evaluation (Signed)
Anesthesia Post Note  Patient: Heather Hawkins  Procedure(s) Performed: LAPAROSCOPY DIAGNOSTIC with IUD removal (Abdomen)     Patient location during evaluation: PACU Anesthesia Type: General Level of consciousness: awake and alert Pain management: pain level controlled Vital Signs Assessment: post-procedure vital signs reviewed and stable Respiratory status: spontaneous breathing, nonlabored ventilation, respiratory function stable and patient connected to nasal cannula oxygen Cardiovascular status: blood pressure returned to baseline and stable Postop Assessment: no apparent nausea or vomiting Anesthetic complications: no   No notable events documented.  Last Vitals:  Vitals:   08/01/21 1345 08/01/21 1400  BP: 113/74 109/75  Pulse: 71 (!) 57  Resp: 16 16  Temp:  36.4 C  SpO2: 100% 100%    Last Pain:  Vitals:   08/01/21 1400  TempSrc:   PainSc: 3                  Kennieth Rad

## 2021-08-01 NOTE — Anesthesia Preprocedure Evaluation (Signed)
Anesthesia Evaluation  Patient identified by MRN, date of birth, ID band Patient awake    Reviewed: Allergy & Precautions, NPO status , Patient's Chart, lab work & pertinent test results  Airway Mallampati: II  TM Distance: >3 FB Neck ROM: Full    Dental  (+) Dental Advisory Given   Pulmonary neg pulmonary ROS,    breath sounds clear to auscultation       Cardiovascular negative cardio ROS   Rhythm:Regular Rate:Normal     Neuro/Psych negative neurological ROS     GI/Hepatic negative GI ROS, Neg liver ROS,   Endo/Other  negative endocrine ROS  Renal/GU negative Renal ROS     Musculoskeletal   Abdominal   Peds  Hematology negative hematology ROS (+)   Anesthesia Other Findings   Reproductive/Obstetrics                             Anesthesia Physical Anesthesia Plan  ASA: 1  Anesthesia Plan: General   Post-op Pain Management:    Induction: Intravenous  PONV Risk Score and Plan: 3 and Dexamethasone, Ondansetron, Midazolam and Treatment may vary due to age or medical condition  Airway Management Planned: Oral ETT  Additional Equipment: None  Intra-op Plan:   Post-operative Plan: Extubation in OR  Informed Consent: I have reviewed the patients History and Physical, chart, labs and discussed the procedure including the risks, benefits and alternatives for the proposed anesthesia with the patient or authorized representative who has indicated his/her understanding and acceptance.     Dental advisory given  Plan Discussed with: CRNA  Anesthesia Plan Comments:         Anesthesia Quick Evaluation

## 2021-08-01 NOTE — Transfer of Care (Signed)
Immediate Anesthesia Transfer of Care Note  Patient: Heather Hawkins  Procedure(s) Performed: LAPAROSCOPY DIAGNOSTIC with IUD removal (Abdomen)  Patient Location: PACU  Anesthesia Type:General  Level of Consciousness: awake, alert  and oriented  Airway & Oxygen Therapy: Patient Spontanous Breathing and Patient connected to face mask oxygen  Post-op Assessment: Report given to RN, Post -op Vital signs reviewed and stable, Patient moving all extremities X 4 and Patient able to stick tongue midline  Post vital signs: stable  Last Vitals:  Vitals Value Taken Time  BP 150/78   Temp 97.7   Pulse 66 08/01/21 1329  Resp 21 08/01/21 1329  SpO2 99 % 08/01/21 1329   Last Pain:  Vitals:   08/01/21 1037  TempSrc:   PainSc: 0-No pain         Complications: No notable events documented.

## 2021-08-01 NOTE — Anesthesia Procedure Notes (Signed)
Procedure Name: Intubation Date/Time: 08/01/2021 12:36 PM Performed by: Cy Blamer, CRNA Pre-anesthesia Checklist: Patient identified, Emergency Drugs available, Suction available and Patient being monitored Patient Re-evaluated:Patient Re-evaluated prior to induction Oxygen Delivery Method: Circle system utilized Preoxygenation: Pre-oxygenation with 100% oxygen Induction Type: IV induction Ventilation: Mask ventilation without difficulty Laryngoscope Size: Miller and 2 Grade View: Grade I Tube type: Oral Tube size: 7.0 mm Number of attempts: 1 Airway Equipment and Method: Stylet Placement Confirmation: ETT inserted through vocal cords under direct vision, positive ETCO2 and breath sounds checked- equal and bilateral Secured at: 22 cm Tube secured with: Tape Dental Injury: Teeth and Oropharynx as per pre-operative assessment

## 2021-08-01 NOTE — Interval H&P Note (Signed)
History and Physical Interval Note:  08/01/2021 12:00 PM  Heather Hawkins  has presented today for surgery, with the diagnosis of IUD displaced in pelvis.  The various methods of treatment have been discussed with the patient and family. After consideration of risks, benefits and other options for treatment, the patient has consented to  Procedure(s): LAPAROSCOPY DIAGNOSTIC with IUD removal (N/A) as a surgical intervention.  The patient's history has been reviewed, patient examined, no change in status, stable for surgery.  I have reviewed the patient's chart and labs.  Questions were answered to the patient's satisfaction.     Reva Bores

## 2021-08-02 ENCOUNTER — Encounter (HOSPITAL_COMMUNITY): Payer: Self-pay | Admitting: Family Medicine

## 2021-08-14 ENCOUNTER — Other Ambulatory Visit: Payer: Self-pay | Admitting: Family Medicine

## 2021-08-14 DIAGNOSIS — Z975 Presence of (intrauterine) contraceptive device: Secondary | ICD-10-CM

## 2021-08-21 ENCOUNTER — Other Ambulatory Visit: Payer: Self-pay

## 2021-08-21 ENCOUNTER — Encounter: Payer: Self-pay | Admitting: Family Medicine

## 2021-08-21 ENCOUNTER — Ambulatory Visit (INDEPENDENT_AMBULATORY_CARE_PROVIDER_SITE_OTHER): Payer: 59 | Admitting: Family Medicine

## 2021-08-21 VITALS — BP 120/78 | HR 101 | Wt 172.9 lb

## 2021-08-21 DIAGNOSIS — Z09 Encounter for follow-up examination after completed treatment for conditions other than malignant neoplasm: Secondary | ICD-10-CM

## 2021-08-21 DIAGNOSIS — Z1211 Encounter for screening for malignant neoplasm of colon: Secondary | ICD-10-CM

## 2021-08-21 DIAGNOSIS — K641 Second degree hemorrhoids: Secondary | ICD-10-CM | POA: Diagnosis not present

## 2021-08-21 DIAGNOSIS — Z975 Presence of (intrauterine) contraceptive device: Secondary | ICD-10-CM | POA: Diagnosis not present

## 2021-08-21 MED ORDER — HYDROCORTISONE ACETATE 25 MG RE SUPP: 25.0000 mg | Freq: Two times a day (BID) | RECTAL | 0 refills | Status: DC

## 2021-08-21 NOTE — Progress Notes (Signed)
   Subjective:    Patient ID: Heather Hawkins is a 45 y.o. female presenting with Routine Post Op  on 08/21/2021  HPI: Here for post op check. Had diagnostic laparoscopy for malpositioned IUD, removed successfully.  Reports some constipation post surgery while on narcotic pain meds. Has painful defecation.  Review of Systems  Constitutional:  Negative for chills and fever.  Respiratory:  Negative for shortness of breath.   Cardiovascular:  Negative for chest pain.  Gastrointestinal:  Negative for abdominal pain, nausea and vomiting.  Genitourinary:  Negative for dysuria.  Skin:  Negative for rash.     Objective:    BP 120/78   Pulse (!) 101   Wt 172 lb 14.4 oz (78.4 kg)   BMI 28.77 kg/m  Physical Exam Constitutional:      General: She is not in acute distress.    Appearance: She is well-developed.  HENT:     Head: Normocephalic and atraumatic.  Eyes:     General: No scleral icterus. Cardiovascular:     Rate and Rhythm: Normal rate.  Pulmonary:     Effort: Pulmonary effort is normal.  Abdominal:     Palpations: Abdomen is soft.  Musculoskeletal:     Cervical back: Neck supple.  Skin:    General: Skin is warm and dry.     Comments: Umbilical incision is well healed and has some dermabond still attached.  Neurological:     Mental Status: She is alert and oriented to person, place, and time.        Assessment & Plan:   Problem List Items Addressed This Visit       Unprioritized   Nexplanon in place - Primary    Bleeding with Nexplanon, stopped with OCs--to stop after this pack.      Other Visit Diagnoses     Postop check       discussed findings, usual healing   Grade II hemorrhoids       rx for anusol given   Relevant Medications   hydrocortisone (ANUSOL-HC) 25 MG suppository   Screen for colon cancer       declined colonoscopy, opted for cologard--order placed   Relevant Orders   Cologuard       Return if symptoms worsen or fail to  improve.  Reva Bores 08/21/2021 8:47 AM

## 2021-08-21 NOTE — Assessment & Plan Note (Signed)
Bleeding with Nexplanon, stopped with OCs--to stop after this pack.

## 2021-09-08 ENCOUNTER — Other Ambulatory Visit: Payer: Self-pay | Admitting: Family Medicine

## 2021-09-08 DIAGNOSIS — Z975 Presence of (intrauterine) contraceptive device: Secondary | ICD-10-CM

## 2021-10-06 ENCOUNTER — Other Ambulatory Visit: Payer: Self-pay | Admitting: Family Medicine

## 2021-10-06 DIAGNOSIS — Z975 Presence of (intrauterine) contraceptive device: Secondary | ICD-10-CM

## 2021-11-20 ENCOUNTER — Encounter: Payer: Self-pay | Admitting: Family Medicine

## 2021-11-21 ENCOUNTER — Telehealth: Payer: Self-pay | Admitting: General Practice

## 2021-11-21 NOTE — Telephone Encounter (Signed)
Called patient at home and asked about completing her cologuard screen. Patient states she hasn't thought about it yet but will do it when she's ready.

## 2021-11-21 NOTE — Telephone Encounter (Signed)
-----   Message from Donnamae Jude, MD sent at 11/20/2021  9:23 AM EST ----- Please call patient and ask her to complete her cologuard. Thanks ----- Message ----- From: SYSTEM Sent: 11/19/2021  12:16 AM EST To: Donnamae Jude, MD

## 2021-12-30 IMAGING — US US OB < 14 WEEKS - US OB TV
1 series · 15 of 28 positions shown · non-contrast
Comparison: None.

CLINICAL DATA: Pregnant patient in early pregnancy with vaginal
bleeding. Beta HCG [DATE]. Gestational age based on 04/25/2020 LMP
of 01/30/2021.

EXAM:
OBSTETRIC <14 WK US AND TRANSVAGINAL OB US
TECHNIQUE: Both transabdominal and transvaginal ultrasound examinations were
performed for complete evaluation of the gestation as well as the
maternal uterus, adnexal regions, and pelvic cul-de-sac.
Transvaginal technique was performed to assess early pregnancy.

[Series 1: us ob < 14 weeks - us ob tv · 53 acquisitions, 15 frames shown]
[im 1/53]
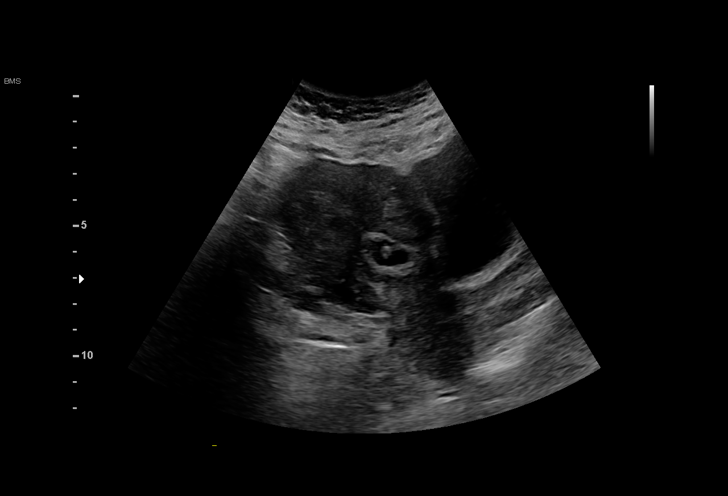
[im 4/53]
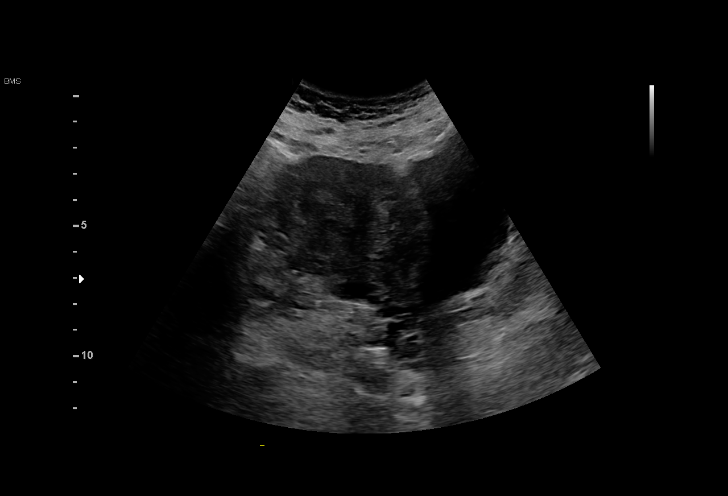
[im 8/53]
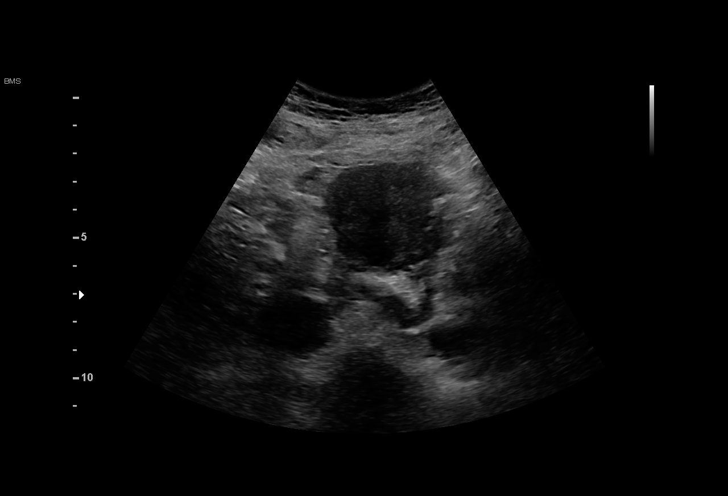
[im 12/53]
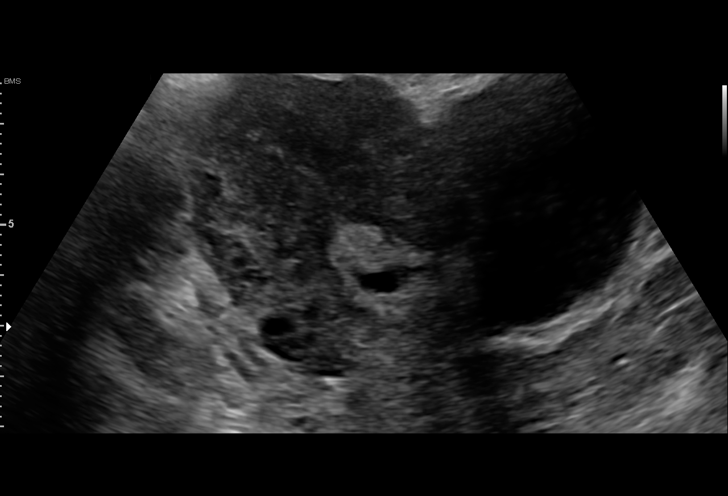
[im 16/53]
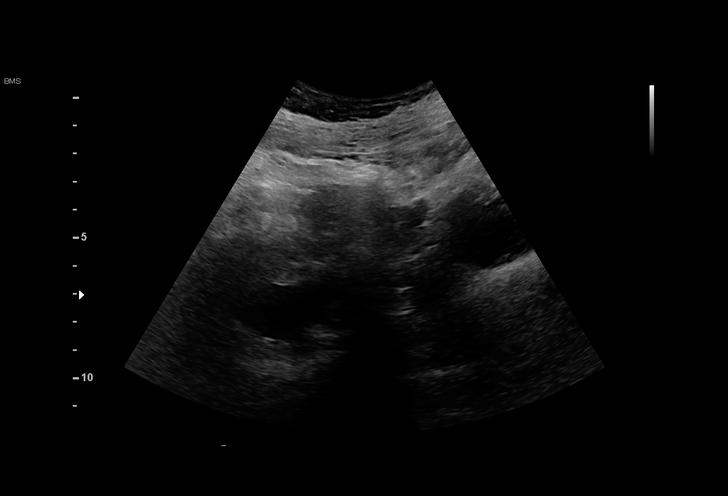
[im 20/53]
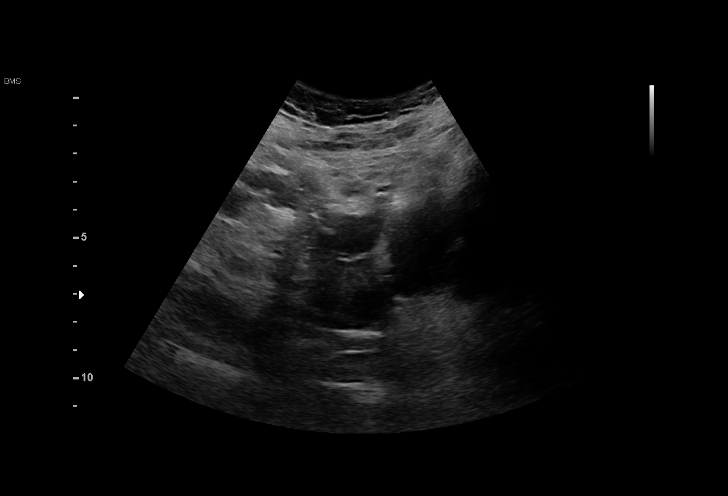
[im 24/53]
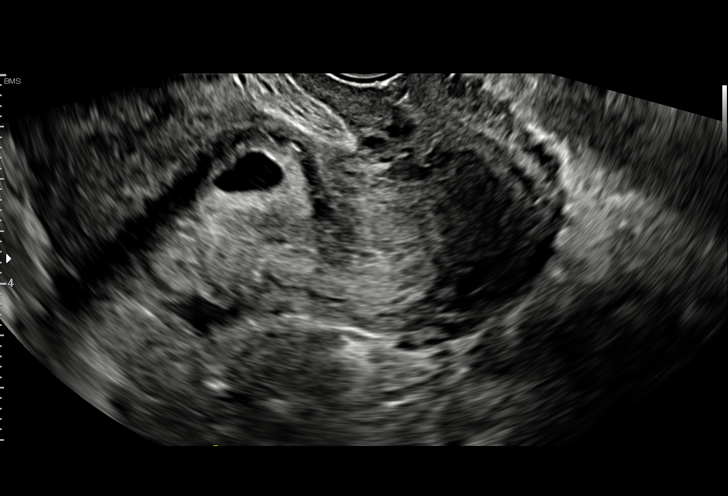
[im 27/53]
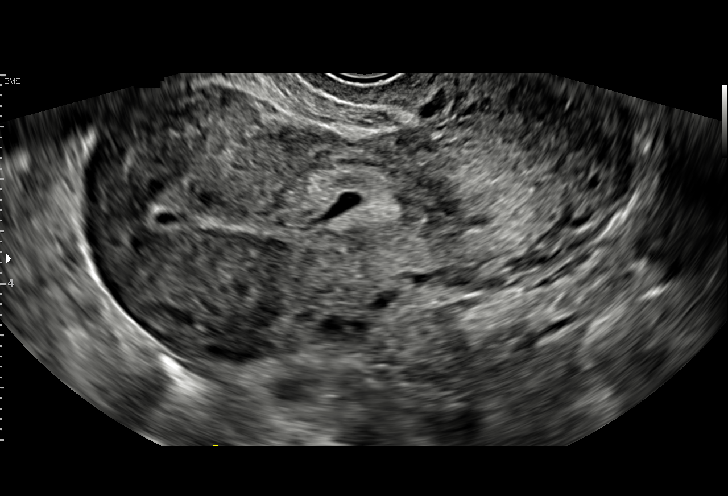
[im 29/53]
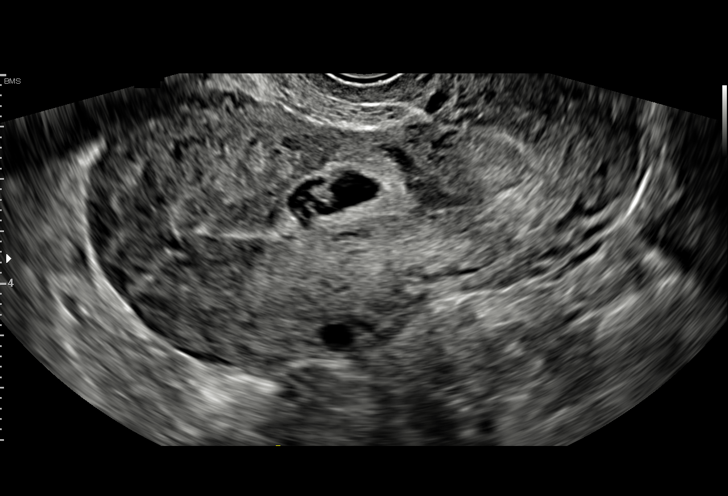
[im 33/53]
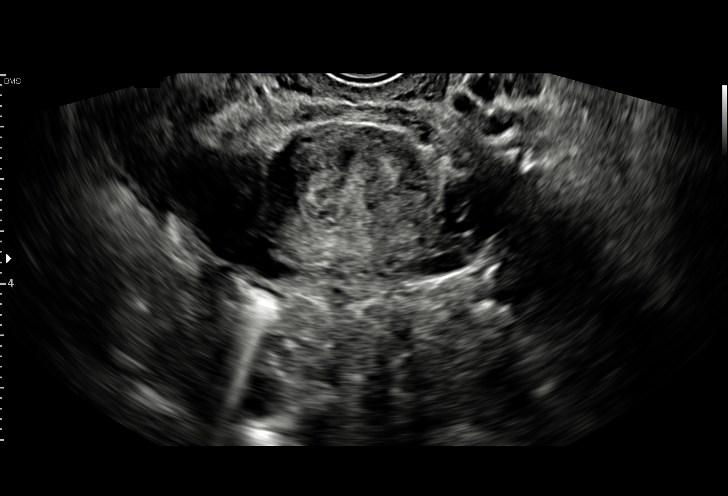
[im 37/53]
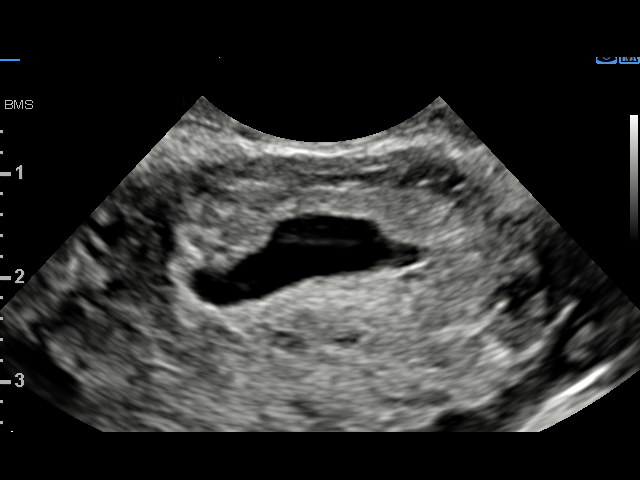
[im 41/53]
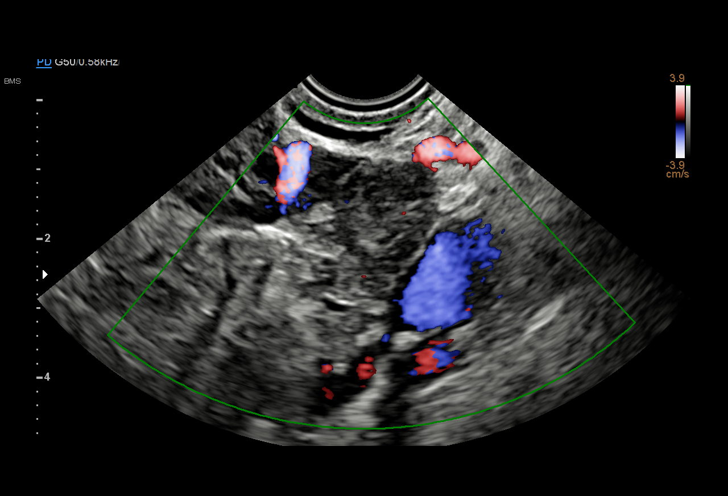
[im 45/53]
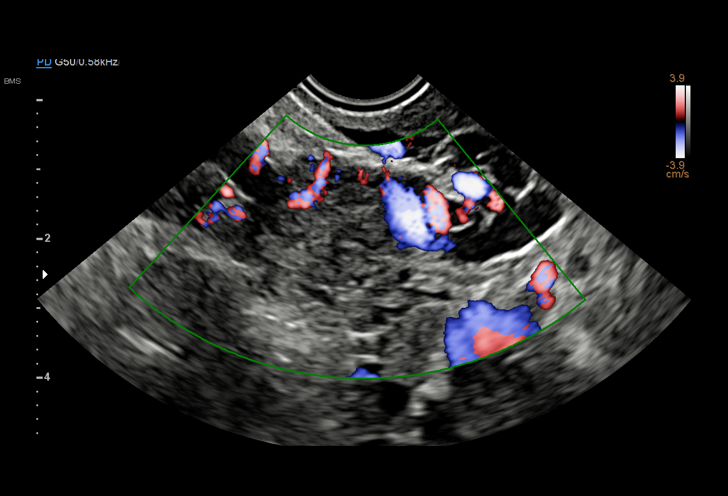
[im 49/53]
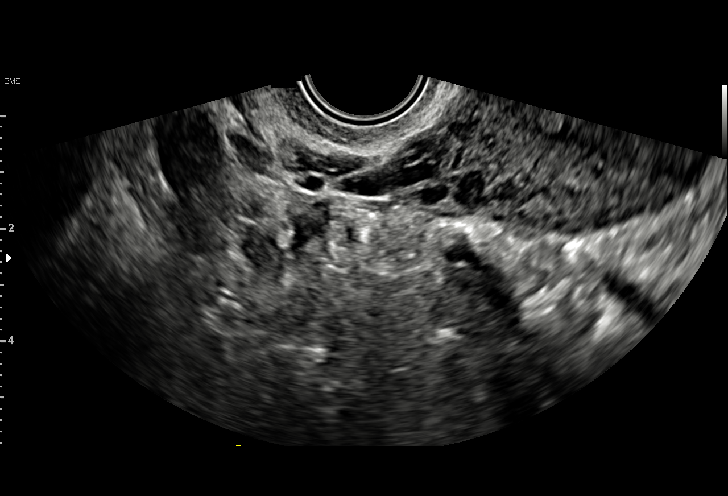
[im 53/53]
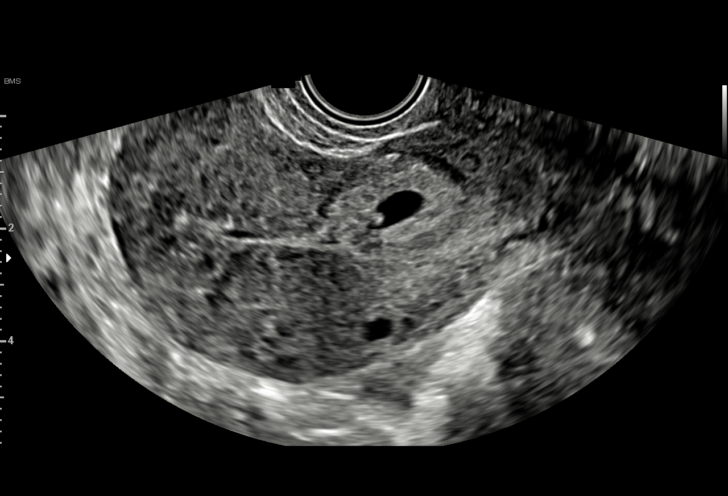

[15 of 28 positions shown; findings below may reference images not displayed]

FINDINGS: Intrauterine gestational sac: Single, irregular in shape located in
the lower uterine segment.

Yolk sac:  Not Visualized.

Embryo:  Not Visualized.

Cardiac Activity: Not Visualized.

MSD: 15 mm   6 w   2 d

Subchorionic hemorrhage:  None visualized.

Maternal uterus/adnexae: The left ovary measures 2.9 x 1.9 x 2.3 cm
and contains rounded vascular structure that may represent a
collapsing corpus luteum. The right ovary is not visualized. There
is no adnexal mass or pelvic free fluid.
IMPRESSION: 1. Single intrauterine gestational sac measuring 15 mm that is
irregular in shape and located in the lower uterine segment. No yolk
sac or fetal pole. Findings are highly suspicious for failed
pregnancy, and discordant for gestational age based on LMP.
Recommend continued trending of beta HCG, sonographic follow-up as
indicated.
2. No findings suspicious for ectopic pregnancy.

## 2022-01-26 IMAGING — US US OB TRANSVAGINAL
1 series · 14 of 28 positions shown · non-contrast
Comparison: 06/28/2020

CLINICAL DATA: Threatened miscarriage, bleeding in early pregnancy

EXAM:
TRANSVAGINAL OB ULTRASOUND
TECHNIQUE: Transvaginal ultrasound was performed for complete evaluation of the
gestation as well as the maternal uterus, adnexal regions, and
pelvic cul-de-sac.

[Series 1: us ob transvaginal · 65 acquisitions, 14 frames shown]
[im 3/65]
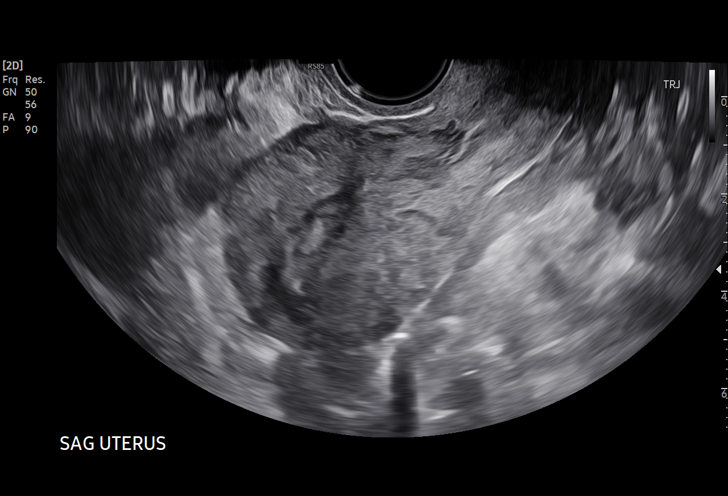
[im 8/65]
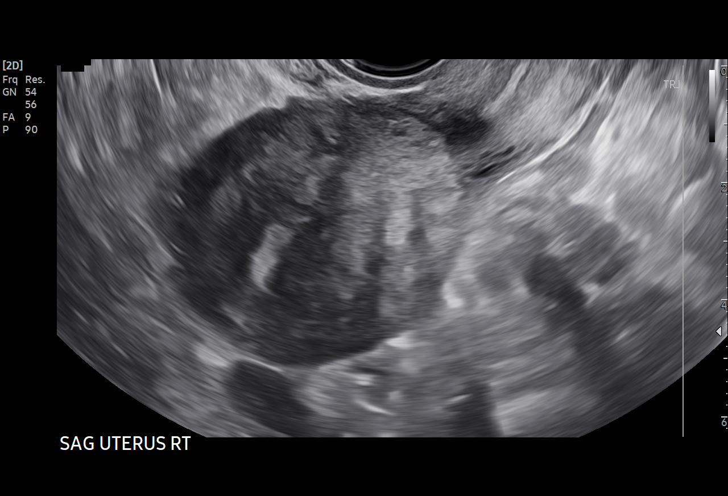
[im 12/65]
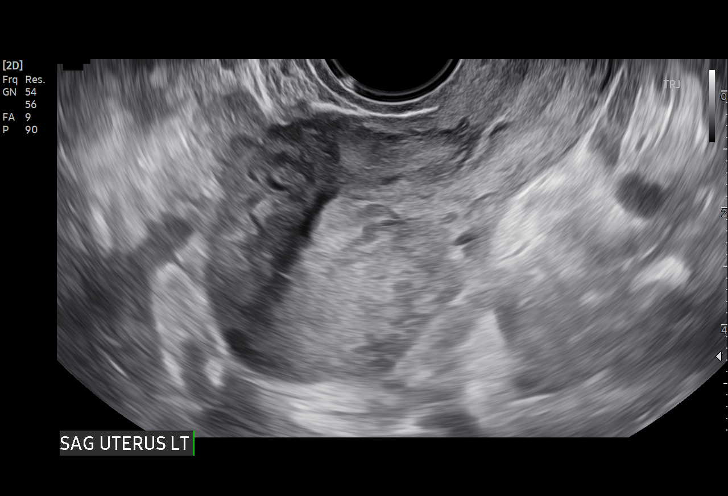
[im 17/65]
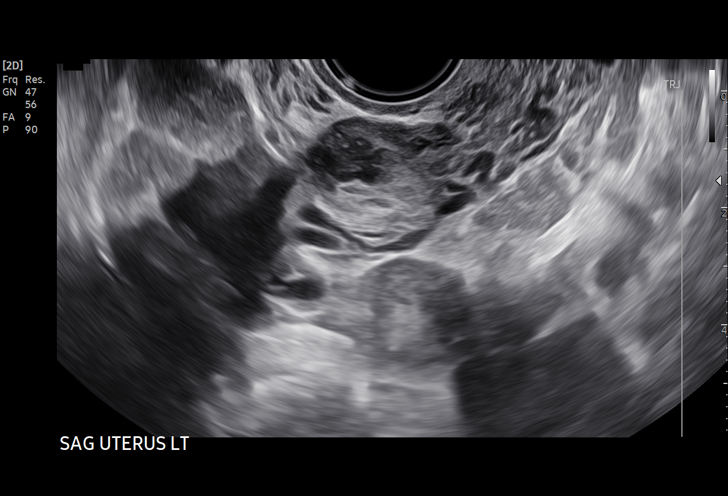
[im 22/65]
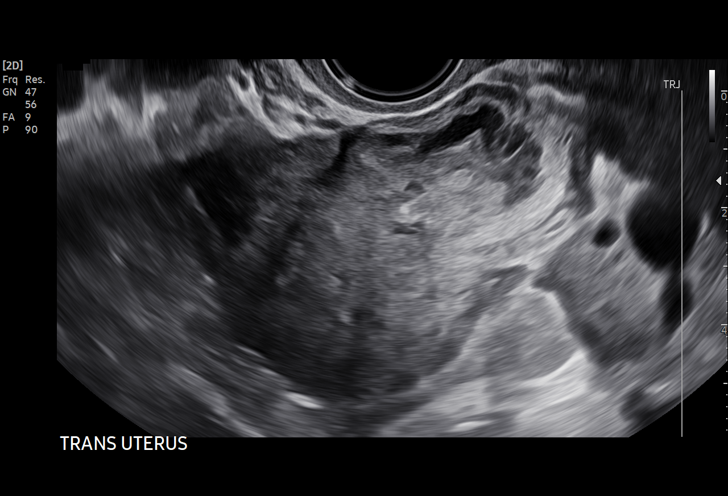
[im 27/65]
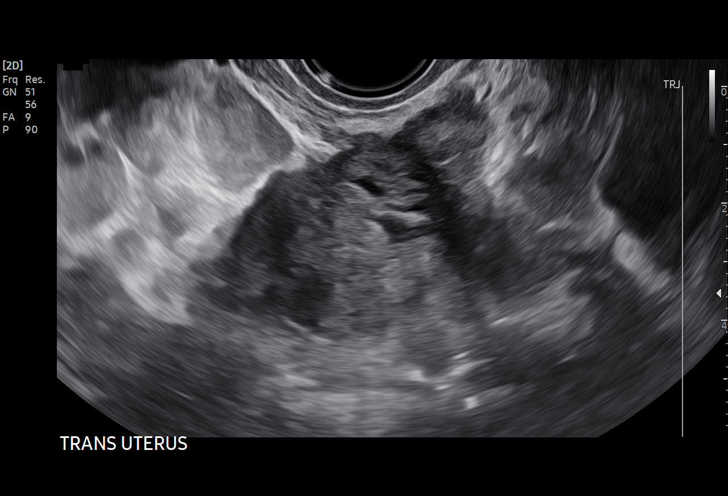
[im 31/65]
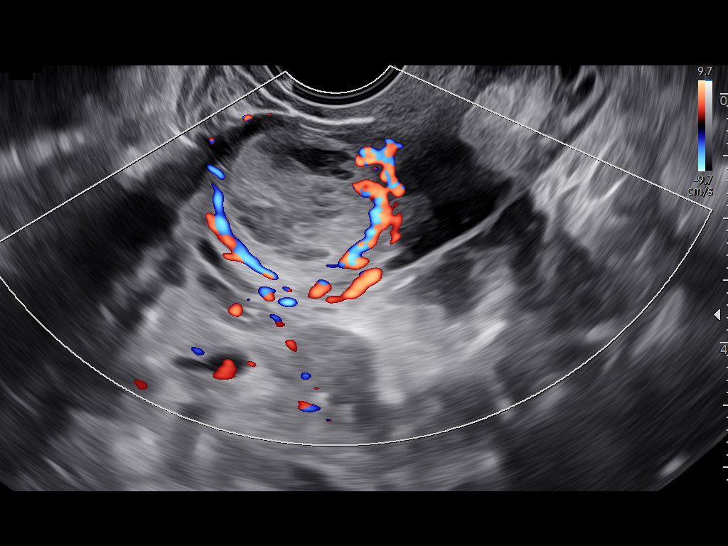
[im 36/65]
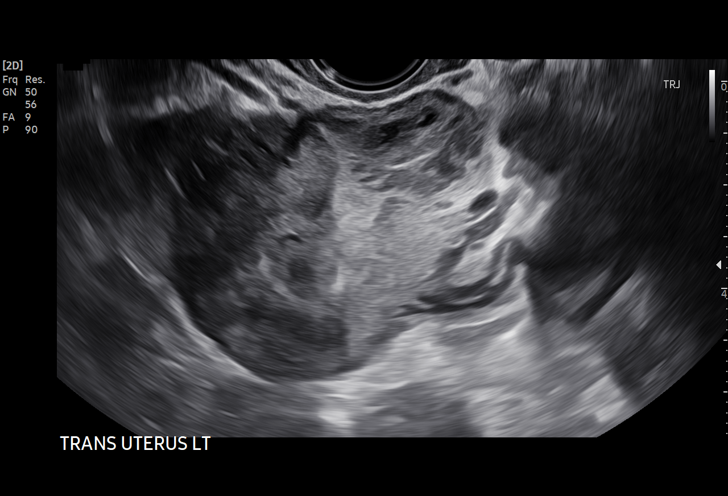
[im 41/65]
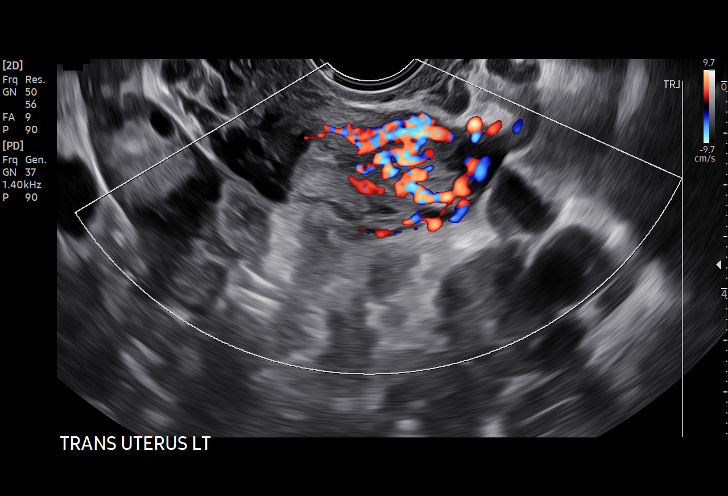
[im 46/65]
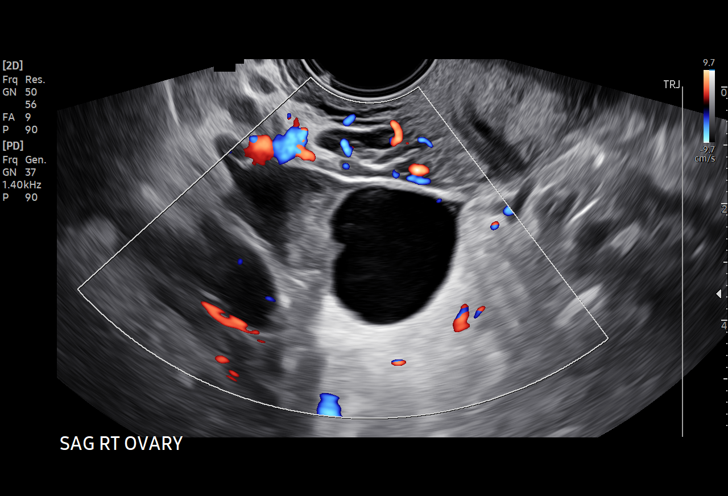
[im 50/65]
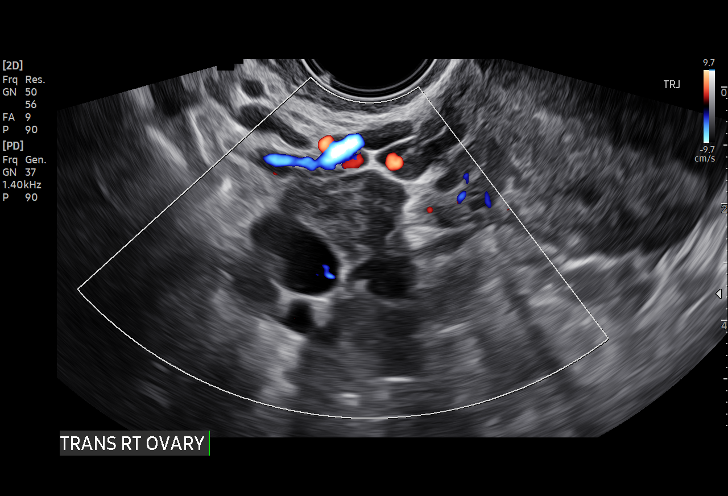
[im 55/65]
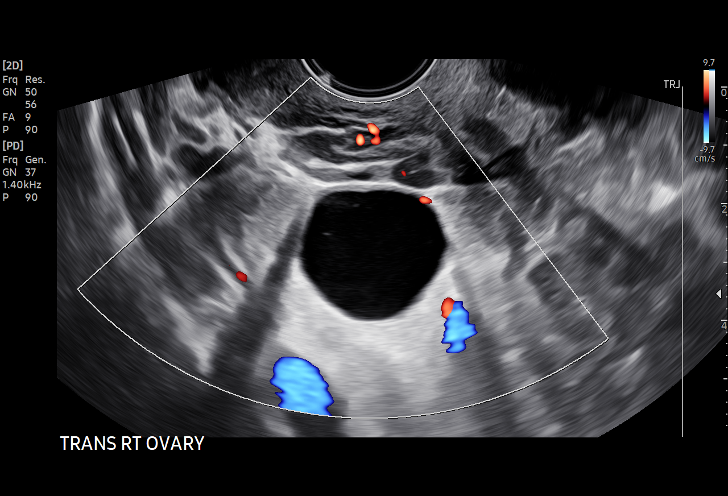
[im 60/65]
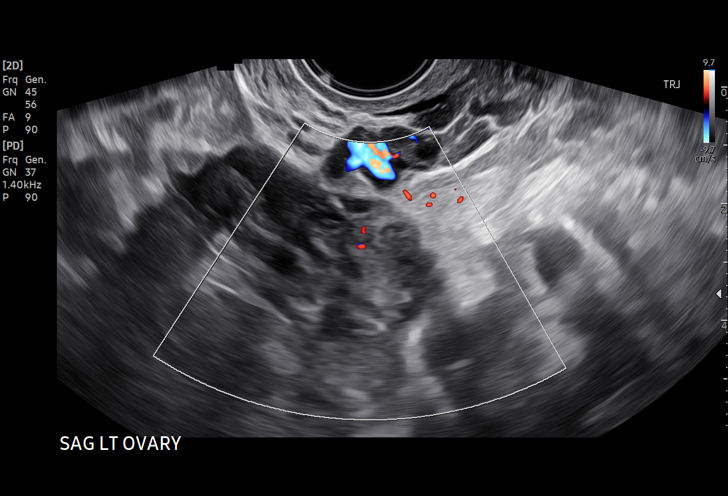
[im 65/65]
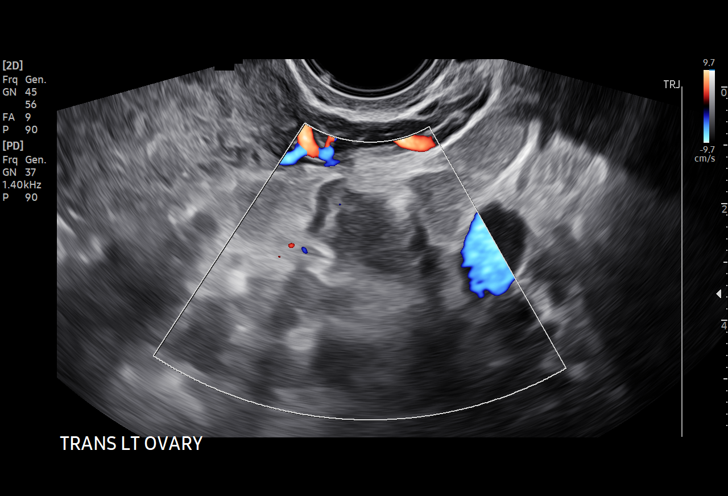

[14 of 28 positions shown; findings below may reference images not displayed]

FINDINGS: Intrauterine gestational sac: Previously identified gestational sac
is only questionably identified on current exam, see below.

Yolk sac:  Not identified

Embryo:  Not identified

Cardiac Activity: N/A

Heart Rate: N/A bpm

MSD:   mm    w     d

CRL:     mm    w  d                  US EDC:

Subchorionic hemorrhage: Suspect moderate subchronic hemorrhage, see
below

Maternal uterus/adnexae:

RIGHT ovary measures 4.1 x 2.1 x 2.9 cm and contains a small corpus
luteal cyst.

LEFT ovary normal size and morphology, 2.4 x 1.7 x 2.4 cm.

Anterior wall myometrial thinning consistent with Caesarean section
scar.

Gestational sac on previous exam was at mid uterus at the Caesarean
section scar consistent with Caesarean scar ectopic pregnancy; this
gestational sac is no longer clearly identified consistent with
failed pregnancy.

At this site, a 3.9 x 3.1 x 2.4 cm diameter mass is now identified,
heterogeneous appearance, containing a small cystic area eccentric
anteriorly question residual gestational sac versus nonspecific
fluid with a larger adjacent heterogeneous region more posteriorly
question hemorrhage versus dilated vascular spaces.

Surrounding hyperechoic material likely combination of endometrium
and trophoblastic reaction.

Significant blood flow is seen at the periphery of this mass on
color Doppler but not internally.

No adnexal masses or free pelvic fluid.
IMPRESSION: Previously identified gestational sac is no longer seen.

3.9 cm diameter mass is seen at the anterior mid uterus at the site
of prior Caesarean scar implantation of the gestational sac, without
identifiable gestational sac or fetal components consistent with
failed intrauterine pregnancy likely a Caesarean scar ectopic
pregnancy.

Heterogeneous area of hypoechogenicity is identified which likely
represents hemorrhage at the prior gestational sac though
correlation with serial quantitative beta HCG recommended to
definitively exclude gestational trophoblastic disease.

Findings discussed with Dr. Owen on 07/25/2020 at 7880 hours.

## 2022-04-23 ENCOUNTER — Telehealth: Payer: Self-pay

## 2022-04-23 NOTE — Telephone Encounter (Signed)
Pt called office and spoke with front office about Nexplanon. Spoke with pt. Pt states having irregular/ light bleeding since having Nexplanon placed in Nov 2022. Pt advised this was normal and to continue to monitor. Advised pt if not happy with contraception, then come back to office for provider visit to change BCM. Pt states will give a few more months and call back. Denies all heavy bleeding or clots at this time.  Mukesh Kornegay,RNC

## 2022-08-06 IMAGING — US US PELVIS COMPLETE WITH TRANSVAGINAL
1 series · 13 of 25 positions shown · non-contrast
Comparison: None

CLINICAL DATA: Missing IUD strings, has Mirena IUD

EXAM:
TRANSABDOMINAL AND TRANSVAGINAL ULTRASOUND OF PELVIS
TECHNIQUE: Both transabdominal and transvaginal ultrasound examinations of the
pelvis were performed. Transabdominal technique was performed for
global imaging of the pelvis including uterus, ovaries, adnexal
regions, and pelvic cul-de-sac. It was necessary to proceed with
endovaginal exam following the transabdominal exam to visualize the
IUD and LEFT ovary.

[Series 1: us pelvis complete with transvaginal · 13 of 92 slices shown]
[im 1/92]
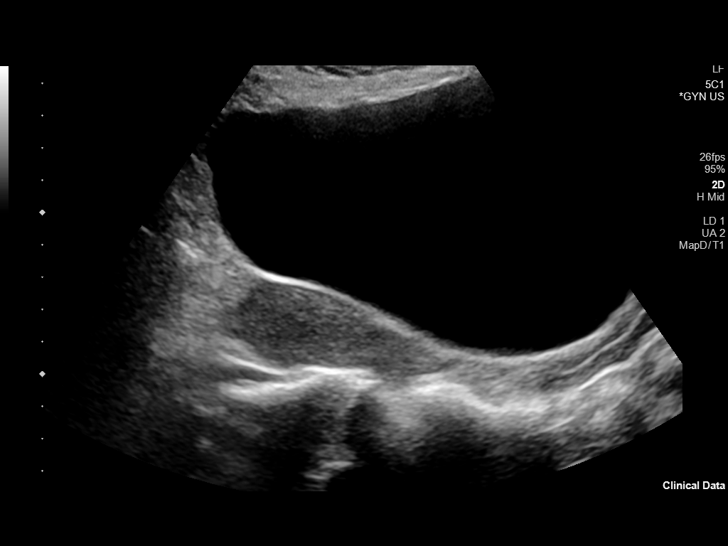
[im 8/92]
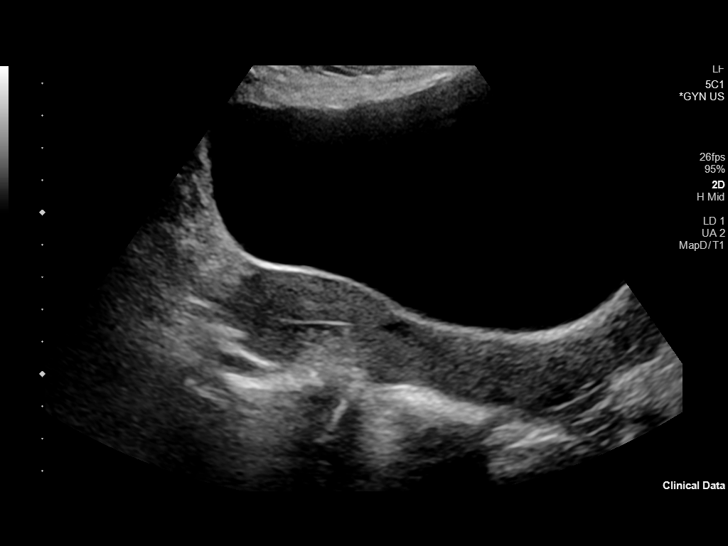
[im 16/92]
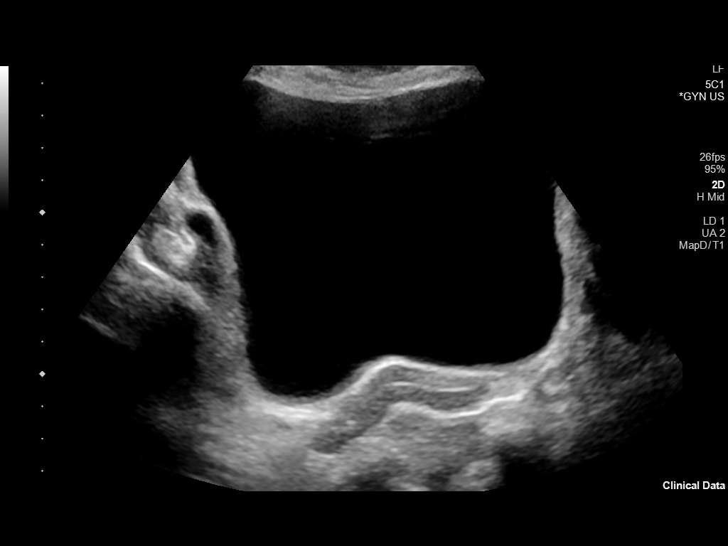
[im 23/92]
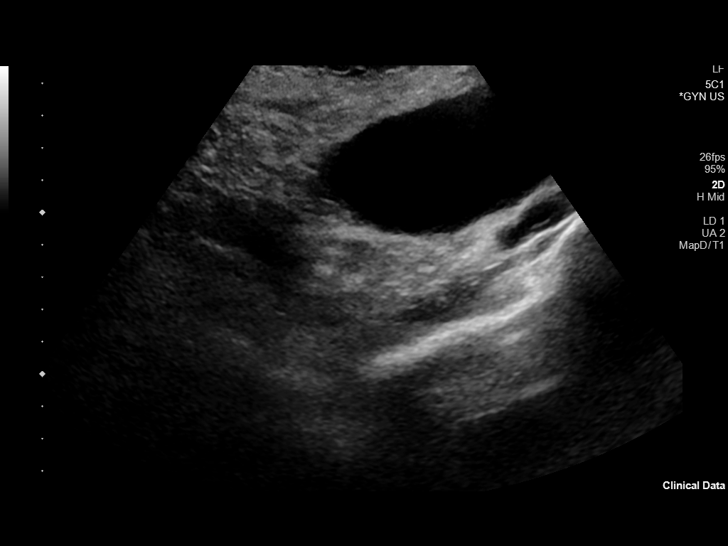
[im 31/92]
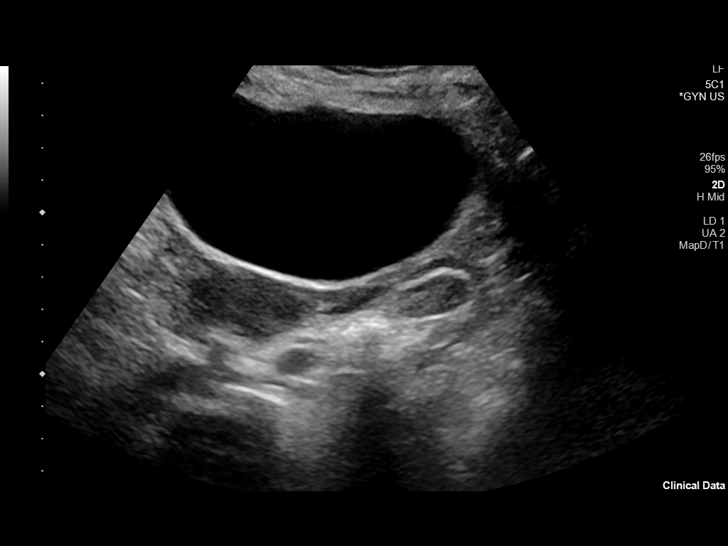
[im 38/92]
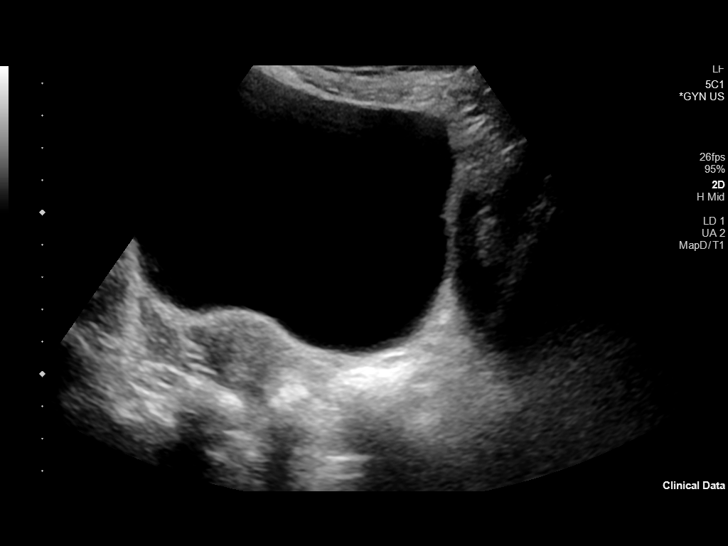
[im 46/92]
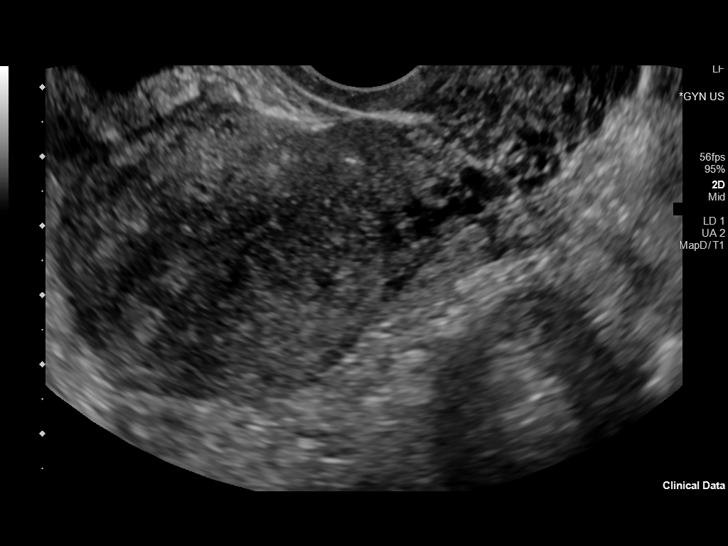
[im 54/92]
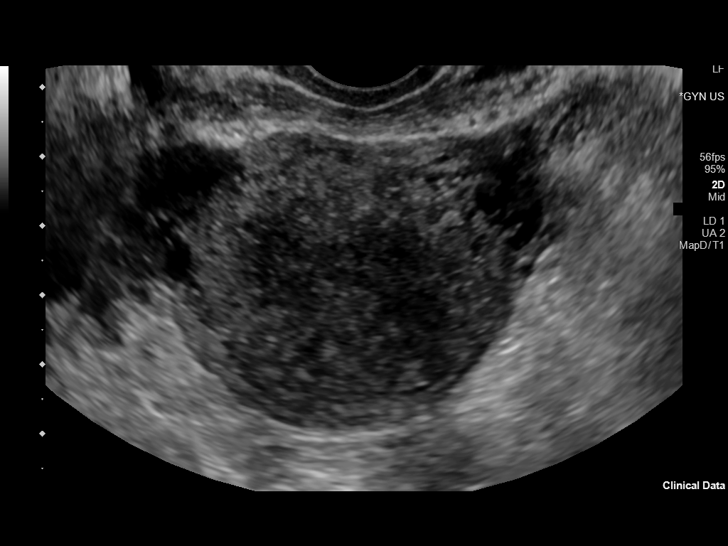
[im 61/92]
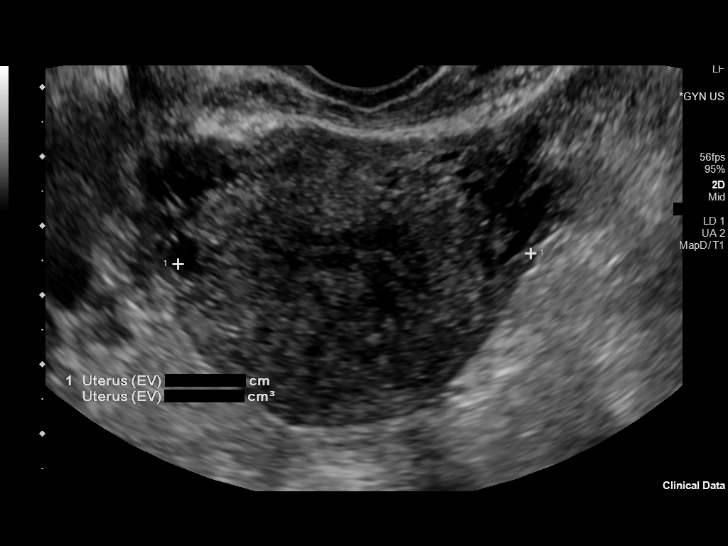
[im 69/92]
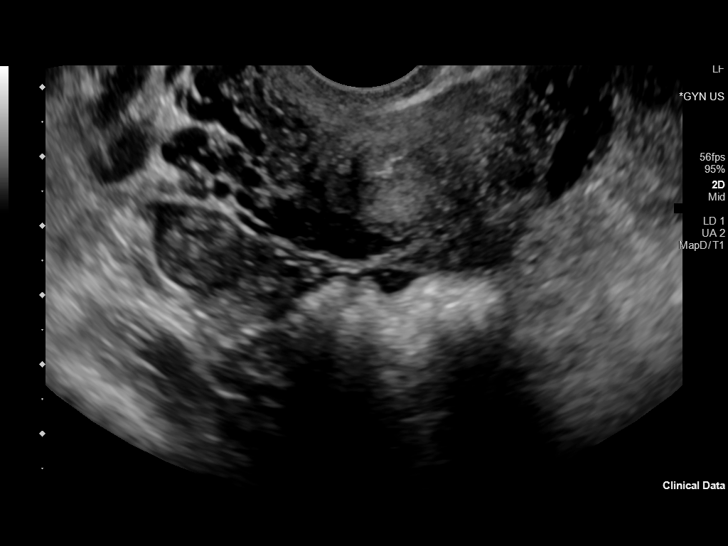
[im 76/92]
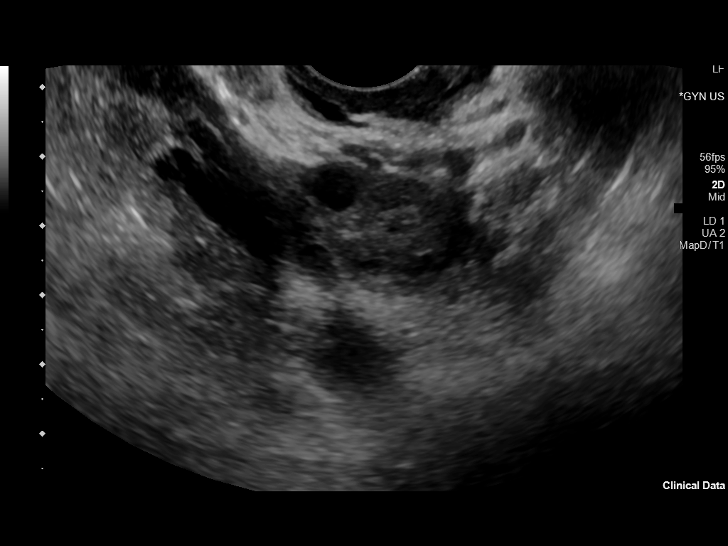
[im 84/92]
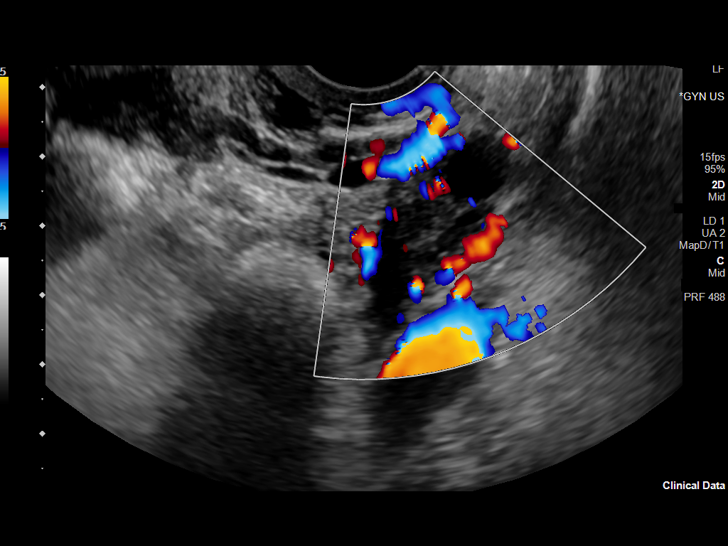
[im 92/92]
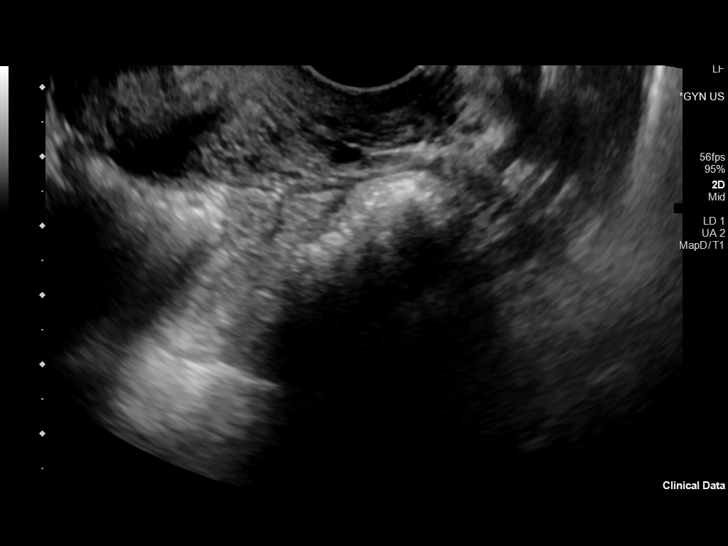

[13 of 25 positions shown; findings below may reference images not displayed]

FINDINGS: Uterus

Measurements: 11.2 x 2.8 x 5.6 cm = volume: 89 mL. Anteverted.
Normal morphology without mass. Slight irregularity of the anterior
wall of the uterus likely reflects prior Caesarean section with
minimal scar.

Endometrium

Thickness: 8 mm. Trace endometrial fluid. No endometrial mass. No
IUD visualized.

Right ovary

Measurements: 2.9 x 1.4 x 1.9 cm = volume: 4.1 mL. Normal morphology
without mass

Left ovary

Measurements: 3.0 x 1.7 x 1.6 cm = volume: 4.2 mL. Normal morphology
without mass

Other findings

No free pelvic fluid.  No adnexal masses.
IMPRESSION: Trace nonspecific endometrial fluid.

Otherwise negative exam.

Specifically, no IUD visualized.

## 2023-08-05 ENCOUNTER — Other Ambulatory Visit: Payer: Self-pay

## 2023-08-05 MED ORDER — AMOXICILLIN 500 MG PO CAPS
500.0000 mg | ORAL_CAPSULE | Freq: Three times a day (TID) | ORAL | 0 refills | Status: DC
  Filled 2023-08-05: qty 21, 7d supply, fill #0

## 2023-08-06 ENCOUNTER — Other Ambulatory Visit: Payer: Self-pay

## 2024-03-17 ENCOUNTER — Other Ambulatory Visit (HOSPITAL_COMMUNITY)
Admission: RE | Admit: 2024-03-17 | Discharge: 2024-03-17 | Disposition: A | Discharge status: Home / Self Care | Source: Ambulatory Visit | Attending: Advanced Practice Midwife | Admitting: Advanced Practice Midwife

## 2024-03-17 ENCOUNTER — Encounter: Payer: Self-pay | Admitting: Advanced Practice Midwife

## 2024-03-17 ENCOUNTER — Ambulatory Visit: Payer: Self-pay | Admitting: Advanced Practice Midwife

## 2024-03-17 VITALS — BP 108/77 | HR 74 | Wt 180.6 lb

## 2024-03-17 DIAGNOSIS — Z1231 Encounter for screening mammogram for malignant neoplasm of breast: Secondary | ICD-10-CM | POA: Diagnosis not present

## 2024-03-17 DIAGNOSIS — N921 Excessive and frequent menstruation with irregular cycle: Secondary | ICD-10-CM

## 2024-03-17 DIAGNOSIS — Z975 Presence of (intrauterine) contraceptive device: Secondary | ICD-10-CM | POA: Diagnosis not present

## 2024-03-17 DIAGNOSIS — Z124 Encounter for screening for malignant neoplasm of cervix: Secondary | ICD-10-CM | POA: Diagnosis present

## 2024-03-17 DIAGNOSIS — N898 Other specified noninflammatory disorders of vagina: Secondary | ICD-10-CM | POA: Diagnosis present

## 2024-03-17 DIAGNOSIS — Z01419 Encounter for gynecological examination (general) (routine) without abnormal findings: Secondary | ICD-10-CM

## 2024-03-17 DIAGNOSIS — Z803 Family history of malignant neoplasm of breast: Secondary | ICD-10-CM

## 2024-03-17 MED ORDER — NORETHIN ACE-ETH ESTRAD-FE 1-20 MG-MCG PO TABS: 1.0000 | ORAL_TABLET | Freq: Every day | ORAL | 3 refills | Status: DC

## 2024-03-17 NOTE — Progress Notes (Signed)
 Pt mother recently died of breast cancer. Would like mammogram referral.   Currently has nexplanon . Periods occurring twice a month.

## 2024-03-17 NOTE — Patient Instructions (Signed)
 Breast Cancer Risk Calculator:  Here is the calculator we talked about.   [email or web address]

## 2024-03-17 NOTE — Progress Notes (Signed)
 Subjective:     Heather Hawkins is a 48 y.o. female here at [facility] for a routine exam.  Current complaints: none. Pt recently lost her mother to breast cancer and wants to have screening for breast and cervical cancer today. She has Nexplanon  in place and frequently has bleeding 2 times per month but this month period was normal x 1.  She has used OCPs for breakthrough pain in the past.  She had IUD that was removed surgically from the abdomen in the past so does not want another IUD or other method of contraception. Personal and family health history reviewed: yes.  Do you have a primary care provider? yes Do you feel safe at home? yes  Flowsheet Row Office Visit from 03/17/2024 in [facility] for [facility] Total Score 0    Health Maintenance Due  Topic Date Due   Hepatitis C Screening  Never done   Hepatitis B Vaccines (1 of 3 - 19+ 3-dose series) Never done   Colonoscopy  Never done   DTaP/Tdap/Td (2 - Td or Tdap) 11/05/2021   COVID-19 Vaccine (3 - 2024-25 season) 05/26/2023     Risk factors for chronic health problems: Smoking: Alchohol/how much: Pt BMI: Body mass index is 30.05 kg/m.   Gynecologic History Patient's last menstrual period was 03/11/2024 (exact date). Contraception: Nexplanon  Last Pap: 11/2020. Results were: normal Last mammogram: 2020 per pt . Results were: normal  Obstetric History OB History  Gravida Para Term Preterm AB Living  5 4 4  0 0 4  SAB IAB Ectopic Multiple Live Births  0 0 0 0 4    # Outcome Date GA Lbr Len/2nd Weight Sex Type Anes PTL Lv  5 Gravida           4 Term 2018     CS-Unspec   LIV  3 Term 12/25/11 [redacted]w[redacted]d  7 lb 0.3 oz (3.184 kg) F CS-LTranv EPI  LIV     Birth Comments: thick meconium, coarse rhonchi, tachycardia  2 Term 02/28/07    M CS-Unspec  N LIV  1 Term 01/05/05    F Vag-Spont  N LIV     The following portions of the patient's history were reviewed and updated as appropriate: allergies,  current medications, past family history, past medical history, past social history, past surgical history, and problem list.  Review of Systems Pertinent items noted in HPI and remainder of comprehensive ROS otherwise negative.    Objective:   Today's Vitals   03/17/24 1456  BP: 108/77  Pulse: 74  Weight: 180 lb 9.6 oz (81.9 kg)   Body mass index is 30.05 kg/m.  VS reviewed, nursing note reviewed,  Constitutional: well developed, well nourished, no distress HEENT: normocephalic, thyroid without enlargement or mass HEART: RRR, no murmurs rubs/gallops RESP: clear and equal to auscultation bilaterally in all lobes  Breast Exam: exam performed: right breast normal without mass, skin or nipple changes or axillary nodes, left breast normal without mass, skin or nipple changes or axillary nodes Abdomen: soft Neuro: alert and oriented x 3 Skin: warm, dry Psych: affect normal Pelvic exam: Performed: Cervix pink, visually closed, without lesion, scant white creamy discharge, vaginal walls and external genitalia normal Bimanual exam: Cervix 0/long/high, firm, anterior, neg CMT, uterus nontender, nonenlarged, adnexa without tenderness, enlargement, or mass        Assessment/Plan:   1. Encounter for annual routine gynecological examination   2. Encounter for screening mammogram for  malignant neoplasm of breast (Primary)  - MM 3D SCREENING MAMMOGRAM BILATERAL BREAST; Future  3. Encounter for screening for cervical cancer  - Cytology - PAP( [facility])  4. Vaginal itching  - Cervicovaginal ancillary only( )  5. Family history of breast cancer in mother --Pt recently lost her mother to breast cancer.  She has not had mammogram in 5 years, so screening mammogram ordered. Breast exam wnl today in office, pt encouraged to do self breast exams.  --Risk calculator info given to patient, and encouraged to follow up if lifetime risk is greater than 20%, as additional  exam/imaging may be recommended.  Discussed genetic screening.   --Pt to do screening mammogram this year, consider additional testing in the future.    6. Breakthrough bleeding on Nexplanon  --Pt to use 28 day pack whenever menses become too frequent with Nexplanon .    - norethindrone -ethinyl estradiol-FE (JUNEL FE 1/20) 1-20 MG-MCG tablet; Take 1 tablet by mouth daily.  Dispense: 28 tablet; Refill: 3   Return in about 1 year (around 03/17/2025) for annual exam.   Olam Boards, CNM 5:39 PM

## 2024-03-18 LAB — CERVICOVAGINAL ANCILLARY ONLY
Bacterial Vaginitis (gardnerella): NEGATIVE
Candida Glabrata: NEGATIVE
Candida Vaginitis: NEGATIVE
Chlamydia: NEGATIVE
Comment: NEGATIVE
Comment: NEGATIVE
Comment: NEGATIVE
Comment: NEGATIVE
Comment: NEGATIVE
Comment: NORMAL
Neisseria Gonorrhea: NEGATIVE
Trichomonas: NEGATIVE

## 2024-03-18 LAB — CYTOLOGY - PAP
Comment: NEGATIVE
Diagnosis: NEGATIVE
Diagnosis: REACTIVE
High risk HPV: NEGATIVE

## 2024-03-23 ENCOUNTER — Ambulatory Visit: Payer: Self-pay | Admitting: Advanced Practice Midwife

## 2024-06-22 ENCOUNTER — Other Ambulatory Visit: Payer: Self-pay

## 2024-06-22 DIAGNOSIS — Z975 Presence of (intrauterine) contraceptive device: Secondary | ICD-10-CM

## 2024-06-22 MED ORDER — NORETHIN ACE-ETH ESTRAD-FE 1-20 MG-MCG PO TABS: 1.0000 | ORAL_TABLET | Freq: Every day | ORAL | 2 refills | Status: AC

## 2024-09-16 ENCOUNTER — Other Ambulatory Visit: Payer: Self-pay | Admitting: Obstetrics and Gynecology

## 2024-09-16 DIAGNOSIS — N921 Excessive and frequent menstruation with irregular cycle: Secondary | ICD-10-CM
